# Patient Record
Sex: Male | Born: 1955 | Race: White | Hispanic: No | Marital: Married | Smoking: Never smoker
Health system: Southern US, Community
[De-identification: ages and names within clinical notes are randomized; demographics above are authoritative.]

## PROBLEM LIST (undated history)

## (undated) DIAGNOSIS — E669 Obesity, unspecified: Secondary | ICD-10-CM

## (undated) DIAGNOSIS — K219 Gastro-esophageal reflux disease without esophagitis: Secondary | ICD-10-CM

## (undated) DIAGNOSIS — G4730 Sleep apnea, unspecified: Secondary | ICD-10-CM

## (undated) DIAGNOSIS — I471 Supraventricular tachycardia, unspecified: Secondary | ICD-10-CM

## (undated) DIAGNOSIS — F32A Depression, unspecified: Secondary | ICD-10-CM

## (undated) DIAGNOSIS — F329 Major depressive disorder, single episode, unspecified: Secondary | ICD-10-CM

## (undated) DIAGNOSIS — F419 Anxiety disorder, unspecified: Secondary | ICD-10-CM

## (undated) DIAGNOSIS — Z9889 Other specified postprocedural states: Secondary | ICD-10-CM

## (undated) DIAGNOSIS — R002 Palpitations: Secondary | ICD-10-CM

## (undated) DIAGNOSIS — M1990 Unspecified osteoarthritis, unspecified site: Secondary | ICD-10-CM

## (undated) DIAGNOSIS — I10 Essential (primary) hypertension: Secondary | ICD-10-CM

## (undated) DIAGNOSIS — R748 Abnormal levels of other serum enzymes: Secondary | ICD-10-CM

## (undated) DIAGNOSIS — Z87442 Personal history of urinary calculi: Secondary | ICD-10-CM

## (undated) HISTORY — PX: OTHER SURGICAL HISTORY: SHX169

## (undated) HISTORY — DX: Anxiety disorder, unspecified: F41.9

## (undated) HISTORY — DX: Supraventricular tachycardia: I47.1

## (undated) HISTORY — DX: Obesity, unspecified: E66.9

## (undated) HISTORY — DX: Gastro-esophageal reflux disease without esophagitis: K21.9

## (undated) HISTORY — DX: Palpitations: R00.2

## (undated) HISTORY — DX: Essential (primary) hypertension: I10

## (undated) HISTORY — DX: Supraventricular tachycardia, unspecified: I47.10

## (undated) HISTORY — DX: Abnormal levels of other serum enzymes: R74.8

## (undated) HISTORY — DX: Sleep apnea, unspecified: G47.30

---

## 1995-01-23 HISTORY — PX: OTHER SURGICAL HISTORY: SHX169

## 2002-06-29 ENCOUNTER — Inpatient Hospital Stay (HOSPITAL_COMMUNITY): Admission: AD | Admit: 2002-06-29 | Discharge: 2002-07-01 | Payer: Self-pay | Admitting: Cardiology

## 2002-06-30 ENCOUNTER — Encounter: Payer: Self-pay | Admitting: Cardiology

## 2004-12-01 ENCOUNTER — Ambulatory Visit: Payer: Self-pay | Admitting: *Deleted

## 2004-12-11 ENCOUNTER — Ambulatory Visit: Payer: Self-pay | Admitting: Cardiology

## 2004-12-20 ENCOUNTER — Ambulatory Visit: Payer: Self-pay | Admitting: Cardiology

## 2010-06-27 DIAGNOSIS — R079 Chest pain, unspecified: Secondary | ICD-10-CM

## 2010-07-05 ENCOUNTER — Encounter: Payer: Self-pay | Admitting: *Deleted

## 2010-07-10 ENCOUNTER — Encounter: Payer: Self-pay | Admitting: Cardiology

## 2010-07-10 DIAGNOSIS — I10 Essential (primary) hypertension: Secondary | ICD-10-CM | POA: Insufficient documentation

## 2010-07-10 DIAGNOSIS — K219 Gastro-esophageal reflux disease without esophagitis: Secondary | ICD-10-CM | POA: Insufficient documentation

## 2010-07-10 DIAGNOSIS — R002 Palpitations: Secondary | ICD-10-CM | POA: Insufficient documentation

## 2010-07-10 DIAGNOSIS — G4730 Sleep apnea, unspecified: Secondary | ICD-10-CM | POA: Insufficient documentation

## 2010-07-10 DIAGNOSIS — R079 Chest pain, unspecified: Secondary | ICD-10-CM | POA: Insufficient documentation

## 2010-07-10 DIAGNOSIS — E669 Obesity, unspecified: Secondary | ICD-10-CM | POA: Insufficient documentation

## 2010-07-10 DIAGNOSIS — I471 Supraventricular tachycardia: Secondary | ICD-10-CM | POA: Insufficient documentation

## 2010-07-10 DIAGNOSIS — R748 Abnormal levels of other serum enzymes: Secondary | ICD-10-CM | POA: Insufficient documentation

## 2010-07-13 ENCOUNTER — Encounter: Payer: Self-pay | Admitting: Cardiology

## 2010-07-13 ENCOUNTER — Ambulatory Visit (INDEPENDENT_AMBULATORY_CARE_PROVIDER_SITE_OTHER): Payer: Federal, State, Local not specified - PPO | Admitting: Cardiology

## 2010-07-13 DIAGNOSIS — R748 Abnormal levels of other serum enzymes: Secondary | ICD-10-CM

## 2010-07-13 DIAGNOSIS — I10 Essential (primary) hypertension: Secondary | ICD-10-CM

## 2010-07-13 DIAGNOSIS — R079 Chest pain, unspecified: Secondary | ICD-10-CM

## 2010-07-13 DIAGNOSIS — R002 Palpitations: Secondary | ICD-10-CM

## 2010-07-13 NOTE — Assessment & Plan Note (Signed)
Blood pressure control today. No change in therapy. 

## 2010-07-13 NOTE — Patient Instructions (Signed)
Your physician wants you to follow-up in: 6 months. You will receive a reminder letter in the mail one-two months in advance. If you don't receive a letter, please call our office to schedule the follow-up appointment. Your physician recommends that you continue on your current medications as directed. Please refer to the Current Medication list given to you today. 

## 2010-07-13 NOTE — Assessment & Plan Note (Signed)
A careful discussion with him.  I suspect that he feels post PAC beats when he feels palpitations.  He is not having prolonged palpitations.  We discussed the possibility of an event recorder but he feel it is not needed at this time.

## 2010-07-13 NOTE — Progress Notes (Signed)
HPI The patient is seen for post hospital visit.  He is doing very well.  He was seen in consultation while he was at [facility] June 29, 2010.  He had a difficult they have worked, felt palpitations, and was admitted to the hospital.  There is a history of supraventricular tachycardia that was documented at 2004. I do not have all the specifics.  When he arrived at the hospital with the current admission there was no documented arrhythmia.  He stabilized.  There was no MI.  He was sent home.  It is of note that his troponins were normal.  However he seems to have mildly elevated CPKs chronically.As part of today's evaluation I reviewed the hospital H&P in all of the corresponding data  Patient has not had any recurrent marked symptoms.  He says that he rarely has palpitation  .No Known Allergies  Current Outpatient Prescriptions  Medication Sig Dispense Refill  . etodolac (LODINE) 400 MG tablet Take 400 mg by mouth 3 (three) times daily.       Marland Kitchen lisinopril (PRINIVIL,ZESTRIL) 10 MG tablet Take 10 mg by mouth daily.        Marland Kitchen loratadine (CLARITIN) 10 MG tablet Take 10 mg by mouth daily.        . metoprolol (TOPROL-XL) 50 MG 24 hr tablet Take 50 mg by mouth daily.        Marland Kitchen omeprazole (PRILOSEC) 20 MG capsule Take 20 mg by mouth daily.          History   Social History  . Marital Status: Married    Spouse Name: N/A    Number of Children: N/A  . Years of Education: N/A   Occupational History  .      works at the [employer]   Social History Main Topics  . Smoking status: Never Smoker   . Smokeless tobacco: Never Used  . Alcohol Use: No  . Drug Use: Not on file  . Sexually Active: Not on file   Other Topics Concern  . Not on file   Social History Narrative  . No narrative on file    Family History  Problem Relation Age of Onset  . Aneurysm Mother   . Heart disease Mother   . COPD Father   . Heart disease Other     family h/o  . Cancer Other     family h/o    Past  Medical History  Diagnosis Date  . Supraventricular tachycardia     successfully treated with adenosine,June 2004  . Chest pain     history of negative stress test,most recently November 2006,history of normal left ventricular function  . Hypertension   . GERD (gastroesophageal reflux disease)   . Obesity   . Elevated CPK     chronically elevated CPK'S  . Anxiety disorder   . Palpitations     with stress  . Sleep apnea     Past Surgical History  Procedure Date  . Left arm surgery     AS A CHILD  . Right shoulder surgery 1997    RECURRENT DISLOCATION AND REPAIR OF ROTATOR CUFF IN 12/2004 BY DR MERRITT    ROS  Patient denies fever, chills, headache, sweats, rash, change in vision, change in hearing, chest pain, cough, nausea vomiting, urinary symptoms.  All other systems are reviewed and are negative.  PHYSICAL EXAM Patient is stable today.  He is overweight.  Head is atraumatic.  Lungs are clear.  Respiratory  effort is not labored.  Cardiac exam reveals an S1-S2.  No clicks or significant murmurs.  There is no jugular venous distention.  The abdomen is soft.  There is no peripheral edema.  There are no musculoskeletal deformities.  No skin rashes. Filed Vitals:   07/13/10 1350  BP: 112/71  Pulse: 78  Height: 5\' 9"  (1.753 m)  Weight: 245 lb (111.131 kg)    EKG is not done today.  ASSESSMENT & PLAN

## 2010-07-13 NOTE — Assessment & Plan Note (Signed)
Etiology of the chronically elevated CPKs is not clear to me.

## 2010-07-13 NOTE — Assessment & Plan Note (Signed)
He did not have significant chest pain.  We are not convinced that he has ischemia.  He and I have carefully discussed the possibility of proceeding with a stress echo.  If we did this and would possibly need to be done with dobutamine as he had trouble walking on the treadmill in the past.  At this point we will not proceed

## 2012-08-03 DIAGNOSIS — R42 Dizziness and giddiness: Secondary | ICD-10-CM

## 2013-03-24 ENCOUNTER — Encounter: Payer: Self-pay | Admitting: Cardiology

## 2013-03-24 ENCOUNTER — Encounter (INDEPENDENT_AMBULATORY_CARE_PROVIDER_SITE_OTHER): Payer: Self-pay

## 2013-03-24 ENCOUNTER — Ambulatory Visit (INDEPENDENT_AMBULATORY_CARE_PROVIDER_SITE_OTHER): Payer: Federal, State, Local not specified - PPO | Admitting: Cardiology

## 2013-03-24 VITALS — BP 111/74 | HR 66 | Ht 69.0 in | Wt 261.8 lb

## 2013-03-24 DIAGNOSIS — R0989 Other specified symptoms and signs involving the circulatory and respiratory systems: Secondary | ICD-10-CM

## 2013-03-24 DIAGNOSIS — I498 Other specified cardiac arrhythmias: Secondary | ICD-10-CM

## 2013-03-24 DIAGNOSIS — R002 Palpitations: Secondary | ICD-10-CM

## 2013-03-24 DIAGNOSIS — R9430 Abnormal result of cardiovascular function study, unspecified: Secondary | ICD-10-CM

## 2013-03-24 DIAGNOSIS — I471 Supraventricular tachycardia: Secondary | ICD-10-CM

## 2013-03-24 DIAGNOSIS — I10 Essential (primary) hypertension: Secondary | ICD-10-CM

## 2013-03-24 DIAGNOSIS — R748 Abnormal levels of other serum enzymes: Secondary | ICD-10-CM

## 2013-03-24 DIAGNOSIS — Z01810 Encounter for preprocedural cardiovascular examination: Secondary | ICD-10-CM

## 2013-03-24 NOTE — Assessment & Plan Note (Signed)
He had an episode of SVT treated with adenosine in 2004. He has not had documented arrhythmias since then. No further workup is needed.

## 2013-03-24 NOTE — Assessment & Plan Note (Signed)
There is a history of mildly chronically elevated CPK.

## 2013-03-24 NOTE — Assessment & Plan Note (Signed)
The patient's overall cardiac status is stable. He is cleared to have knee surgery as indicated. There is no proven coronary disease. There is history of normal left ventricular function. His exercise level is good other than limitations by his knee. His EKG is normal. He has history of good left jugular function in the past. Overall he is stable from the cardiac viewpoint. He is cleared to have his knee surgery. He does not need any further testing.

## 2013-03-24 NOTE — Assessment & Plan Note (Signed)
In 2006 he had some chest discomfort. Nuclear study was normal. There's been no recurrence since then. No further workup is needed.

## 2013-03-24 NOTE — Patient Instructions (Signed)
Your physician recommends that you schedule a follow-up appointment as needed. Your physician recommends that you continue on your current medications as directed. Please refer to the Current Medication list given to you today. You are cleared for your orthopedic surgery and your surgeon will be contacted.

## 2013-03-24 NOTE — Assessment & Plan Note (Signed)
Blood pressures control. No change in therapy. 

## 2013-03-24 NOTE — Assessment & Plan Note (Signed)
Historically ejection fraction is normal. I've chosen not to repeat studies at this time.

## 2013-03-24 NOTE — Progress Notes (Signed)
HPI  The patient is seen today for preoperative cardiac clearance. He needs to have total knee repair. I have seen the patient in the past. In 2006 he had a stress nuclear scan. The ejection fraction was normal and there was no scar or ischemia. There is no documented coronary disease. He did have an episode of supraventricular tachycardia in the past. This was treated successfully with adenosine IV. He has not had any clinical recurrences. [facility]. At that time he had an extremely stressful situation at work. He felt poorly and was transported to the hospital. He stabilized and there was no definite cardiac abnormality. It is of note that there is a history of some mild chronic CPK elevation.  Recently he's been active other than difficulty with his knees. 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 This does not cause any significant symptoms. He is not having any chest pain or shortness of breath.  No Known Allergies  Current Outpatient Prescriptions  Medication Sig Dispense Refill  . diclofenac (VOLTAREN) 75 MG EC tablet Take 75 mg by mouth 2 (two) times daily.      Marland Kitchen. lisinopril-hydrochlorothiazide (PRINZIDE,ZESTORETIC) 20-12.5 MG per tablet Take 1 tablet by mouth every morning.      . loratadine (CLARITIN) 10 MG tablet Take 10 mg by mouth daily.        . metoprolol (TOPROL-XL) 50 MG 24 hr tablet Take 50 mg by mouth daily.        Marland Kitchen. omeprazole (PRILOSEC) 20 MG capsule Take 20 mg by mouth daily.         No current facility-administered medications for this visit.    History   Social History  . Marital Status: Married    Spouse Name: N/A    Number of Children: N/A  . Years of Education: N/A   Occupational History  .      works at the post office   Social History Main Topics  . Smoking status: Never Smoker   . Smokeless tobacco: Never Used  . Alcohol Use: No  . Drug Use: Not on file  . Sexual Activity:  Not on file   Other Topics Concern  . Not on file   Social History Narrative  . No narrative on file    Family History  Problem Relation Age of Onset  . Aneurysm Mother   . Heart disease Mother   . COPD Father   . Heart disease Other     family h/o  . Cancer Other     family h/o    Past Medical History  Diagnosis Date  . Supraventricular tachycardia     successfully treated with adenosine,June 2004  . Chest pain     history of negative stress test,most recently November 2006,history of normal left ventricular function  . Hypertension   . GERD (gastroesophageal reflux disease)   . Obesity   . Elevated CPK     chronically elevated CPK'S  . Anxiety disorder   . Palpitations     with stress  . Sleep apnea   . Ejection fraction     Past Surgical History  Procedure Laterality Date  . Left arm surgery      AS A CHILD  . Right shoulder surgery  1997    RECURRENT DISLOCATION AND REPAIR OF ROTATOR CUFF IN 12/2004 BY DR MERRITT    Patient Active Problem List   Diagnosis  Date Noted  . Ejection fraction   . Supraventricular tachycardia   . Chest pain   . Hypertension   . GERD (gastroesophageal reflux disease)   . Obesity   . Elevated CPK   . Palpitations   . Sleep apnea     ROS   Patient denies fever, chills, headache, sweats, rash, change in vision, change in hearing, chest pain, cough, nausea vomiting, urinary symptoms. All other systems are reviewed and are negative.  PHYSICAL EXAM  Patient is overweight. He is oriented to person time and place. Affect is normal. There is no jugulovenous distention. Lungs are clear. Respiratory effort is nonlabored. Cardiac exam reveals S1 and S2. There no clicks or significant murmurs. The abdomen is soft. There is no peripheral edema. There no musculoskeletal deformities. There are no skin rashes.  Filed Vitals:   03/24/13 1438  BP: 111/74  Pulse: 66  Height: 5\' 9"  (1.753 m)  Weight: 261 lb 12.8 oz (118.752 kg)  SpO2:  98%   EKG is done today and reviewed by me. There is normal sinus rhythm. This is a normal EKG. I compared this EKG to a tracing dated August 03, 2012. There is no change.  ASSESSMENT & PLAN

## 2013-04-03 ENCOUNTER — Telehealth: Payer: Self-pay | Admitting: Cardiology

## 2013-04-03 NOTE — Telephone Encounter (Signed)
New message  Tresa EndoKelly with [facility] called to follow up on the clearance for total knee replacement that was faxed 03/16/2013. She states she will fax another

## 2013-04-03 NOTE — Telephone Encounter (Signed)
Clearance was given on last ov/ I will take that to MR to be faxed.

## 2013-04-13 ENCOUNTER — Encounter (HOSPITAL_COMMUNITY): Payer: Self-pay | Admitting: Pharmacy Technician

## 2013-04-16 ENCOUNTER — Other Ambulatory Visit: Payer: Self-pay | Admitting: Physician Assistant

## 2013-04-16 NOTE — Pre-Procedure Instructions (Addendum)
Keefe Zawistowski  04/16/2013   Your procedure is scheduled on:  Fri, April 3   Report to [facility] Entrance A at 7:30 AM.  Call this number if you have problems the morning of surgery: [phone]   Remember:   Do not eat food or drink liquids after midnight.   Take these medicines the morning of surgery with A SIP OF WATER: Claritin(Loratadine-if needed),Omeprazole(Prilosec),Metoprolol(Toprol),and Pain Pill(if needed)              Stop taking your  and Diclfenac. Stop using your Diclofenac gel. No Goody's,BC's,Aleve,Ibuprofen,Fish Oil,or any Herbal Medications.   Do not wear jewelry  Do not wear lotions, powders, or perfumes. You may wear deodorant.  Do not shave 48 hours prior to surgery. Men may shave face and neck.  Do not bring valuables to the hospital.  [facility] is not responsible                  for any belongings or valuables.               Contacts, dentures or bridgework may not be worn into surgery.  Leave suitcase in the car. After surgery it may be brought to your room.  For patients admitted to the hospital, discharge time is determined by your                treatment team.                 Special Instructions:  [place] - Preparing for Surgery  Before surgery, you can play an important role.  Because skin is not sterile, your skin needs to be as free of germs as possible.  You can reduce the number of germs on you skin by washing with CHG (chlorahexidine gluconate) soap before surgery.  CHG is an antiseptic cleaner which kills germs and bonds with the skin to continue killing germs even after washing.  Please DO NOT use if you have an allergy to CHG or antibacterial soaps.  If your skin becomes reddened/irritated stop using the CHG and inform your nurse when you arrive at Short Stay.  Do not shave (including legs and underarms) for at least 48 hours prior to the first CHG shower.  You may shave your face.  Please follow these instructions carefully:   1.   Shower with CHG Soap the night before surgery and the                                morning of Surgery.  2.  If you choose to wash your hair, wash your hair first as usual with your       normal shampoo.  3.  After you shampoo, rinse your hair and body thoroughly to remove the                      Shampoo.  4.  Use CHG as you would any other liquid soap.  You can apply chg directly       to the skin and wash gently with scrungie or a clean washcloth.  5.  Apply the CHG Soap to your body ONLY FROM THE NECK DOWN.        Do not use on open wounds or open sores.  Avoid contact with your eyes,       ears, mouth and genitals (private parts).  Wash genitals (private  parts)       with your normal soap.  6.  Wash thoroughly, paying special attention to the area where your surgery        will be performed.  7.  Thoroughly rinse your body with warm water from the neck down.  8.  DO NOT shower/wash with your normal soap after using and rinsing off       the CHG Soap.  9.  Pat yourself dry with a clean towel.            10.  Wear clean pajamas.            11.  Place clean sheets on your bed the night of your first shower and do not        sleep with pets.  Day of Surgery  Do not apply any lotions/deoderants the morning of surgery.  Please wear clean clothes to the hospital/surgery center.     Please read over the following fact sheets that you were given: Pain Booklet, Coughing and Deep Breathing, Blood Transfusion Information, MRSA Information and Surgical Site Infection Prevention

## 2013-04-16 NOTE — Progress Notes (Signed)
Anesthesia Chart Review:  Patient is a 58 year old male scheduled for right TKA on 04/24/13 by Dr. Madelon Lipsaffrey.  History includes non-smoker, SVT (treated with adenosine '04) on b-blocker therapy, chest pain in 2006 with negative stress test, mild chronic CPK elevation, HTN, GERD, anxiety disorder, OSA, obesity. PCP is Dr. Donzetta Sprungerry Daniel.  He referred patient to cardiologist Dr. Myrtis SerKatz for a preoperative evaluation who cleared him without need for additional pre-operative cardiac testing.    EKG on 03/24/13 showed NSR.  Additional diagnostic testing pending his PAT evaluation.   Velna Ochsllison [place] Sarinana, PA-C [facility]/Anesthesiology Phone [phone] 04/16/2013 4:33 PM

## 2013-04-16 NOTE — H&P (Signed)
TOTAL KNEE ADMISSION H&P  Patient is being admitted for right total knee arthroplasty.  Subjective:  Chief Complaint:right knee pain.  HPI: Alan Swanson, 58 y.o. male, has a history of pain and functional disability in the right knee due to arthritis and has failed non-surgical conservative treatments for greater than 12 weeks to includeNSAID's and/or analgesics, corticosteriod injections, viscosupplementation injections, flexibility and strengthening excercises, supervised PT with diminished ADL's post treatment, use of assistive devices and activity modification.  Onset of symptoms was gradual, starting 1 years ago with gradually worsening course since that time. The patient noted prior procedures on the knee to include  arthroscopy, menisectomy and microfracture on the right knee(s).  Patient currently rates pain in the right knee(s) at 9 out of 10 with activity. Patient has night pain, worsening of pain with activity and weight bearing, pain that interferes with activities of daily living, pain with passive range of motion, crepitus and joint swelling.  Patient has evidence of periarticular osteophytes and joint space narrowing by imaging studies.. There is no active infection.  Patient Active Problem List   Diagnosis Date Noted  . Preop cardiovascular exam 03/24/2013  . Ejection fraction   . Supraventricular tachycardia   . Chest pain   . Hypertension   . GERD (gastroesophageal reflux disease)   . Obesity   . Elevated CPK   . Palpitations   . Sleep apnea    Past Medical History  Diagnosis Date  . Supraventricular tachycardia     successfully treated with adenosine,June 2004  . Chest pain     history of negative stress test,most recently November 2006,history of normal left ventricular function  . Hypertension   . GERD (gastroesophageal reflux disease)   . Obesity   . Elevated CPK     chronically elevated CPK'S  . Anxiety disorder   . Palpitations     with stress  . Sleep  apnea   . Ejection fraction     Past Surgical History  Procedure Laterality Date  . Left arm surgery      AS A CHILD  . Right shoulder surgery  1997    RECURRENT DISLOCATION AND REPAIR OF ROTATOR CUFF IN 12/2004 BY DR MERRITT     (Not in a hospital admission) No Known Allergies  History  Substance Use Topics  . Smoking status: Never Smoker   . Smokeless tobacco: Never Used  . Alcohol Use: No    Family History  Problem Relation Age of Onset  . Aneurysm Mother   . Heart disease Mother   . COPD Father   . Heart disease Other     family h/o  . Cancer Other     family h/o     Review of Systems  Constitutional: Negative.   HENT: Positive for hearing loss and tinnitus. Negative for congestion, ear discharge, ear pain, nosebleeds and sore throat.   Eyes: Negative.   Respiratory: Positive for shortness of breath. Negative for cough, hemoptysis, sputum production, wheezing and stridor.   Cardiovascular: Positive for palpitations. Negative for chest pain, orthopnea, claudication, leg swelling and PND.  Gastrointestinal: Negative.   Genitourinary: Negative.   Musculoskeletal: Positive for joint pain. Negative for falls.  Skin: Negative.   Neurological: Negative.  Negative for headaches.  Endo/Heme/Allergies: Negative.   Psychiatric/Behavioral: Negative.     Objective:  Physical Exam  Constitutional: He is oriented to person, place, and time. He appears well-developed and well-nourished. No distress.  HENT:  Head: Normocephalic and atraumatic.  Nose:  Nose normal.  Eyes: Conjunctivae and EOM are normal. Pupils are equal, round, and reactive to light.  Neck: Normal range of motion. Neck supple.  Cardiovascular: Normal rate, regular rhythm, normal heart sounds and intact distal pulses.   Respiratory: Effort normal and breath sounds normal. No respiratory distress. He has no wheezes. He has no rales. He exhibits no tenderness.  GI: Soft. Bowel sounds are normal. He exhibits no  distension. There is no tenderness.  Musculoskeletal:       Right knee: He exhibits bony tenderness. He exhibits no laceration, no erythema, no LCL laxity and no MCL laxity. Tenderness found. Medial joint line and lateral joint line tenderness noted.  Lymphadenopathy:    He has no cervical adenopathy.  Neurological: He is alert and oriented to person, place, and time. No cranial nerve deficit.  Skin: Skin is warm and dry. No rash noted. No erythema.  Psychiatric: He has a normal mood and affect. His behavior is normal.    Vital signs in last 24 hours: @VSRANGES @  Labs:   Estimated body mass index is 38.64 kg/(m^2) as calculated from the following:   Height as of 03/24/13: 5\' 9"  (1.753 m).   Weight as of 03/24/13: 118.752 kg (261 lb 12.8 oz).   Imaging Review Plain radiographs demonstrate severe degenerative joint disease of the right knee(s). The overall alignment ismild varus. The bone quality appears to be good for age and reported activity level.  Assessment/Plan:  End stage arthritis, right knee   The patient history, physical examination, clinical judgment of the provider and imaging studies are consistent with end stage degenerative joint disease of the right knee(s) and total knee arthroplasty is deemed medically necessary. The treatment options including medical management, injection therapy arthroscopy and arthroplasty were discussed at length. The risks and benefits of total knee arthroplasty were presented and reviewed. The risks due to aseptic loosening, infection, stiffness, patella tracking problems, thromboembolic complications and other imponderables were discussed. The patient acknowledged the explanation, agreed to proceed with the plan and consent was signed. Patient is being admitted for inpatient treatment for surgery, pain control, PT, OT, prophylactic antibiotics, VTE prophylaxis, progressive ambulation and ADL's and discharge planning. The patient is planning to be  discharged home with home health services

## 2013-04-17 ENCOUNTER — Encounter (HOSPITAL_COMMUNITY)
Admission: RE | Admit: 2013-04-17 | Discharge: 2013-04-17 | Disposition: A | Discharge status: Home / Self Care | Source: Ambulatory Visit | Attending: Orthopedic Surgery | Admitting: Orthopedic Surgery

## 2013-04-17 ENCOUNTER — Encounter (HOSPITAL_COMMUNITY): Payer: Self-pay

## 2013-04-17 ENCOUNTER — Encounter (HOSPITAL_COMMUNITY)
Admission: RE | Admit: 2013-04-17 | Discharge: 2013-04-17 | Disposition: A | Discharge status: Home / Self Care | Source: Ambulatory Visit | Attending: Anesthesiology | Admitting: Anesthesiology

## 2013-04-17 DIAGNOSIS — Z01812 Encounter for preprocedural laboratory examination: Secondary | ICD-10-CM | POA: Diagnosis not present

## 2013-04-17 DIAGNOSIS — Z01818 Encounter for other preprocedural examination: Secondary | ICD-10-CM | POA: Insufficient documentation

## 2013-04-17 HISTORY — DX: Other specified postprocedural states: Z98.890

## 2013-04-17 HISTORY — DX: Depression, unspecified: F32.A

## 2013-04-17 HISTORY — DX: Unspecified osteoarthritis, unspecified site: M19.90

## 2013-04-17 HISTORY — DX: Major depressive disorder, single episode, unspecified: F32.9

## 2013-04-17 HISTORY — DX: Personal history of urinary calculi: Z87.442

## 2013-04-17 LAB — CBC
HEMATOCRIT: 44.1 % (ref 39.0–52.0)
Hemoglobin: 14.9 g/dL (ref 13.0–17.0)
MCH: 29.7 pg (ref 26.0–34.0)
MCHC: 33.8 g/dL (ref 30.0–36.0)
MCV: 87.8 fL (ref 78.0–100.0)
Platelets: 169 10*3/uL (ref 150–400)
RBC: 5.02 MIL/uL (ref 4.22–5.81)
RDW: 13.3 % (ref 11.5–15.5)
WBC: 8.2 10*3/uL (ref 4.0–10.5)

## 2013-04-17 LAB — BASIC METABOLIC PANEL
BUN: 29 mg/dL — AB (ref 6–23)
CO2: 26 mEq/L (ref 19–32)
CREATININE: 1.6 mg/dL — AB (ref 0.50–1.35)
Calcium: 9.8 mg/dL (ref 8.4–10.5)
Chloride: 100 mEq/L (ref 96–112)
GFR calc Af Amer: 54 mL/min — ABNORMAL LOW (ref >90)
GFR, EST NON AFRICAN AMERICAN: 47 mL/min — AB (ref >90)
Glucose, Bld: 106 mg/dL — ABNORMAL HIGH (ref 70–99)
Potassium: 4.4 mEq/L (ref 3.7–5.3)
Sodium: 140 mEq/L (ref 137–147)

## 2013-04-17 LAB — ABO/RH: ABO/RH(D): O POS

## 2013-04-17 LAB — PROTIME-INR
INR: 0.95 (ref 0.00–1.49)
Prothrombin Time: 12.5 seconds (ref 11.6–15.2)

## 2013-04-17 LAB — APTT: aPTT: 30 seconds (ref 24–37)

## 2013-04-17 LAB — TYPE AND SCREEN
ABO/RH(D): O POS
ANTIBODY SCREEN: NEGATIVE

## 2013-04-17 LAB — SURGICAL PCR SCREEN
MRSA, PCR: NEGATIVE
Staphylococcus aureus: NEGATIVE

## 2013-04-30 MED ORDER — TRANEXAMIC ACID 100 MG/ML IV SOLN
1000.0000 mg | INTRAVENOUS | Status: AC
  Administered 2013-05-01: 1000 mg via INTRAVENOUS
  Filled 2013-04-30 (×2): qty 10

## 2013-04-30 MED ORDER — CEFAZOLIN SODIUM-DEXTROSE 2-3 GM-% IV SOLR
2.0000 g | INTRAVENOUS | Status: AC
Start: 2013-05-01 — End: 2013-05-01
  Administered 2013-05-01: 2 g via INTRAVENOUS
  Filled 2013-04-30: qty 50

## 2013-05-01 ENCOUNTER — Encounter (HOSPITAL_COMMUNITY): Payer: Self-pay | Admitting: *Deleted

## 2013-05-01 ENCOUNTER — Encounter (HOSPITAL_COMMUNITY): Admitting: Vascular Surgery

## 2013-05-01 ENCOUNTER — Encounter (HOSPITAL_COMMUNITY)
Admission: RE | Disposition: A | Discharge status: Home Health | Payer: Self-pay | Source: Ambulatory Visit | Attending: Orthopedic Surgery

## 2013-05-01 ENCOUNTER — Inpatient Hospital Stay (HOSPITAL_COMMUNITY): Admitting: Anesthesiology

## 2013-05-01 ENCOUNTER — Inpatient Hospital Stay (HOSPITAL_COMMUNITY)
Admission: RE | Admit: 2013-05-01 | Discharge: 2013-05-04 | DRG: 470 | Disposition: A | Discharge status: Home Health | Source: Ambulatory Visit | Attending: Orthopedic Surgery | Admitting: Orthopedic Surgery

## 2013-05-01 DIAGNOSIS — I10 Essential (primary) hypertension: Secondary | ICD-10-CM | POA: Diagnosis present

## 2013-05-01 DIAGNOSIS — E669 Obesity, unspecified: Secondary | ICD-10-CM | POA: Diagnosis present

## 2013-05-01 DIAGNOSIS — M1710 Unilateral primary osteoarthritis, unspecified knee: Principal | ICD-10-CM | POA: Diagnosis present

## 2013-05-01 DIAGNOSIS — K219 Gastro-esophageal reflux disease without esophagitis: Secondary | ICD-10-CM | POA: Diagnosis present

## 2013-05-01 DIAGNOSIS — I498 Other specified cardiac arrhythmias: Secondary | ICD-10-CM | POA: Diagnosis present

## 2013-05-01 DIAGNOSIS — N289 Disorder of kidney and ureter, unspecified: Secondary | ICD-10-CM | POA: Diagnosis present

## 2013-05-01 DIAGNOSIS — F3289 Other specified depressive episodes: Secondary | ICD-10-CM | POA: Diagnosis present

## 2013-05-01 DIAGNOSIS — G4733 Obstructive sleep apnea (adult) (pediatric): Secondary | ICD-10-CM | POA: Diagnosis present

## 2013-05-01 DIAGNOSIS — F329 Major depressive disorder, single episode, unspecified: Secondary | ICD-10-CM | POA: Diagnosis present

## 2013-05-01 DIAGNOSIS — Z6836 Body mass index (BMI) 36.0-36.9, adult: Secondary | ICD-10-CM

## 2013-05-01 DIAGNOSIS — F411 Generalized anxiety disorder: Secondary | ICD-10-CM | POA: Diagnosis present

## 2013-05-01 DIAGNOSIS — M25569 Pain in unspecified knee: Secondary | ICD-10-CM | POA: Diagnosis present

## 2013-05-01 DIAGNOSIS — M1711 Unilateral primary osteoarthritis, right knee: Secondary | ICD-10-CM | POA: Diagnosis present

## 2013-05-01 HISTORY — PX: TOTAL KNEE ARTHROPLASTY: SHX125

## 2013-05-01 LAB — CBC
HCT: 42.7 % (ref 39.0–52.0)
Hemoglobin: 14.4 g/dL (ref 13.0–17.0)
MCH: 29.6 pg (ref 26.0–34.0)
MCHC: 33.7 g/dL (ref 30.0–36.0)
MCV: 87.7 fL (ref 78.0–100.0)
Platelets: 188 10*3/uL (ref 150–400)
RBC: 4.87 MIL/uL (ref 4.22–5.81)
RDW: 13.7 % (ref 11.5–15.5)
WBC: 19.3 10*3/uL — AB (ref 4.0–10.5)

## 2013-05-01 LAB — CREATININE, SERUM
CREATININE: 1.16 mg/dL (ref 0.50–1.35)
GFR calc non Af Amer: 69 mL/min — ABNORMAL LOW (ref >90)
GFR, EST AFRICAN AMERICAN: 80 mL/min — AB (ref >90)

## 2013-05-01 SURGERY — ARTHROPLASTY, KNEE, TOTAL
Anesthesia: General | Site: Knee | Laterality: Right

## 2013-05-01 MED ORDER — MIDAZOLAM HCL 2 MG/2ML IJ SOLN
INTRAMUSCULAR | Status: AC
  Administered 2013-05-01: 2 mg
  Filled 2013-05-01: qty 2

## 2013-05-01 MED ORDER — CHLORHEXIDINE GLUCONATE 4 % EX LIQD: 60.0000 mL | Freq: Once | CUTANEOUS | Status: DC

## 2013-05-01 MED ORDER — METHOCARBAMOL 500 MG PO TABS
500.0000 mg | ORAL_TABLET | Freq: Four times a day (QID) | ORAL | Status: DC | PRN
  Administered 2013-05-02 – 2013-05-04 (×7): 500 mg via ORAL
  Filled 2013-05-01 (×8): qty 1

## 2013-05-01 MED ORDER — SUCCINYLCHOLINE CHLORIDE 20 MG/ML IJ SOLN
INTRAMUSCULAR | Status: DC | PRN
  Administered 2013-05-01: 120 mg via INTRAVENOUS

## 2013-05-01 MED ORDER — DEXAMETHASONE SODIUM PHOSPHATE 10 MG/ML IJ SOLN
10.0000 mg | Freq: Three times a day (TID) | INTRAMUSCULAR | Status: AC
  Administered 2013-05-01 (×2): 10 mg via INTRAVENOUS
  Filled 2013-05-01 (×3): qty 1

## 2013-05-01 MED ORDER — OXYCODONE HCL 5 MG PO TABS: 5.0000 mg | ORAL_TABLET | Freq: Once | ORAL | Status: DC | PRN

## 2013-05-01 MED ORDER — MIDAZOLAM HCL 2 MG/2ML IJ SOLN
INTRAMUSCULAR | Status: AC
  Filled 2013-05-01: qty 2

## 2013-05-01 MED ORDER — SUCCINYLCHOLINE CHLORIDE 20 MG/ML IJ SOLN
INTRAMUSCULAR | Status: AC
  Filled 2013-05-01: qty 1

## 2013-05-01 MED ORDER — DEXTROSE 5 % IV SOLN
500.0000 mg | Freq: Four times a day (QID) | INTRAVENOUS | Status: DC | PRN
  Filled 2013-05-01: qty 5

## 2013-05-01 MED ORDER — PHENOL 1.4 % MT LIQD: 1.0000 | OROMUCOSAL | Status: DC | PRN

## 2013-05-01 MED ORDER — HYDROCHLOROTHIAZIDE 12.5 MG PO CAPS
12.5000 mg | ORAL_CAPSULE | Freq: Every day | ORAL | Status: DC
  Administered 2013-05-02 – 2013-05-04 (×3): 12.5 mg via ORAL
  Filled 2013-05-01 (×3): qty 1

## 2013-05-01 MED ORDER — BISACODYL 5 MG PO TBEC
5.0000 mg | DELAYED_RELEASE_TABLET | Freq: Every day | ORAL | Status: DC | PRN
Start: 2013-05-01 — End: 2013-05-04
  Administered 2013-05-03: 5 mg via ORAL
  Filled 2013-05-01: qty 1

## 2013-05-01 MED ORDER — PANTOPRAZOLE SODIUM 40 MG PO TBEC
40.0000 mg | DELAYED_RELEASE_TABLET | Freq: Every day | ORAL | Status: DC
Start: 2013-05-02 — End: 2013-05-04
  Administered 2013-05-02 – 2013-05-04 (×3): 40 mg via ORAL
  Filled 2013-05-01 (×3): qty 1

## 2013-05-01 MED ORDER — DEXAMETHASONE SODIUM PHOSPHATE 10 MG/ML IJ SOLN
INTRAMUSCULAR | Status: DC | PRN
  Administered 2013-05-01: 4 mg

## 2013-05-01 MED ORDER — CEFAZOLIN SODIUM-DEXTROSE 2-3 GM-% IV SOLR
2.0000 g | Freq: Four times a day (QID) | INTRAVENOUS | Status: AC
  Administered 2013-05-01 (×2): 2 g via INTRAVENOUS
  Filled 2013-05-01 (×3): qty 50

## 2013-05-01 MED ORDER — FENTANYL CITRATE 0.05 MG/ML IJ SOLN
100.0000 ug | Freq: Once | INTRAMUSCULAR | Status: AC
  Administered 2013-05-01: 100 ug via INTRAVENOUS

## 2013-05-01 MED ORDER — LIDOCAINE HCL (CARDIAC) 20 MG/ML IV SOLN
INTRAVENOUS | Status: DC | PRN
  Administered 2013-05-01: 100 mg via INTRAVENOUS

## 2013-05-01 MED ORDER — MENTHOL 3 MG MT LOZG: 1.0000 | LOZENGE | OROMUCOSAL | Status: DC | PRN

## 2013-05-01 MED ORDER — PROMETHAZINE HCL 25 MG/ML IJ SOLN
INTRAMUSCULAR | Status: AC
Start: 2013-05-01 — End: 2013-05-02
  Filled 2013-05-01: qty 1

## 2013-05-01 MED ORDER — OXYCODONE HCL 5 MG PO TABS
5.0000 mg | ORAL_TABLET | ORAL | Status: DC | PRN
  Administered 2013-05-01 – 2013-05-02 (×4): 10 mg via ORAL
  Administered 2013-05-02: 5 mg via ORAL
  Administered 2013-05-02 – 2013-05-04 (×12): 10 mg via ORAL
  Filled 2013-05-01 (×18): qty 2

## 2013-05-01 MED ORDER — SENNOSIDES-DOCUSATE SODIUM 8.6-50 MG PO TABS: 1.0000 | ORAL_TABLET | Freq: Every evening | ORAL | Status: DC | PRN

## 2013-05-01 MED ORDER — FENTANYL CITRATE 0.05 MG/ML IJ SOLN
INTRAMUSCULAR | Status: AC
  Filled 2013-05-01: qty 5

## 2013-05-01 MED ORDER — LISINOPRIL-HYDROCHLOROTHIAZIDE 20-12.5 MG PO TABS: 1.0000 | ORAL_TABLET | Freq: Every morning | ORAL | Status: DC

## 2013-05-01 MED ORDER — LABETALOL HCL 5 MG/ML IV SOLN
INTRAVENOUS | Status: AC
  Filled 2013-05-01: qty 4

## 2013-05-01 MED ORDER — LIDOCAINE HCL (CARDIAC) 20 MG/ML IV SOLN
INTRAVENOUS | Status: AC
  Filled 2013-05-01: qty 5

## 2013-05-01 MED ORDER — HYDROMORPHONE HCL PF 1 MG/ML IJ SOLN
0.2500 mg | INTRAMUSCULAR | Status: DC | PRN
  Administered 2013-05-01 (×2): 0.5 mg via INTRAVENOUS

## 2013-05-01 MED ORDER — BUPIVACAINE-EPINEPHRINE PF 0.5-1:200000 % IJ SOLN
INTRAMUSCULAR | Status: DC | PRN
  Administered 2013-05-01: 25 mL

## 2013-05-01 MED ORDER — LORATADINE 10 MG PO TABS
10.0000 mg | ORAL_TABLET | Freq: Every day | ORAL | Status: DC | PRN
Start: 2013-05-01 — End: 2013-05-04
  Filled 2013-05-01: qty 1

## 2013-05-01 MED ORDER — METOCLOPRAMIDE HCL 5 MG/ML IJ SOLN: 5.0000 mg | Freq: Three times a day (TID) | INTRAMUSCULAR | Status: DC | PRN

## 2013-05-01 MED ORDER — ACETAMINOPHEN 650 MG RE SUPP: 650.0000 mg | Freq: Four times a day (QID) | RECTAL | Status: DC | PRN

## 2013-05-01 MED ORDER — FENTANYL CITRATE 0.05 MG/ML IJ SOLN: 50.0000 ug | Freq: Once | INTRAMUSCULAR | Status: DC

## 2013-05-01 MED ORDER — ONDANSETRON HCL 4 MG/2ML IJ SOLN
4.0000 mg | Freq: Four times a day (QID) | INTRAMUSCULAR | Status: DC | PRN
  Administered 2013-05-01: 4 mg via INTRAVENOUS
  Filled 2013-05-01: qty 2

## 2013-05-01 MED ORDER — PROPOFOL 10 MG/ML IV BOLUS
INTRAVENOUS | Status: AC
  Filled 2013-05-01: qty 20

## 2013-05-01 MED ORDER — ONDANSETRON HCL 4 MG PO TABS
4.0000 mg | ORAL_TABLET | Freq: Four times a day (QID) | ORAL | Status: DC | PRN
  Administered 2013-05-02: 4 mg via ORAL
  Filled 2013-05-01: qty 1

## 2013-05-01 MED ORDER — OXYCODONE HCL 5 MG PO TABS: ORAL_TABLET | ORAL | Status: DC

## 2013-05-01 MED ORDER — ENOXAPARIN SODIUM 30 MG/0.3ML ~~LOC~~ SOLN
30.0000 mg | Freq: Two times a day (BID) | SUBCUTANEOUS | Status: DC
  Administered 2013-05-02 – 2013-05-04 (×5): 30 mg via SUBCUTANEOUS
  Filled 2013-05-01 (×7): qty 0.3

## 2013-05-01 MED ORDER — SODIUM CHLORIDE 0.9 % IR SOLN
Status: DC | PRN
  Administered 2013-05-01: 1000 mL

## 2013-05-01 MED ORDER — FENTANYL CITRATE 0.05 MG/ML IJ SOLN
INTRAMUSCULAR | Status: AC
  Filled 2013-05-01: qty 2

## 2013-05-01 MED ORDER — 0.9 % SODIUM CHLORIDE (POUR BTL) OPTIME
TOPICAL | Status: DC | PRN
  Administered 2013-05-01: 1000 mL

## 2013-05-01 MED ORDER — LACTATED RINGERS IV SOLN
INTRAVENOUS | Status: DC
  Administered 2013-05-01: 08:00:00 via INTRAVENOUS

## 2013-05-01 MED ORDER — METOCLOPRAMIDE HCL 10 MG PO TABS: 5.0000 mg | ORAL_TABLET | Freq: Three times a day (TID) | ORAL | Status: DC | PRN

## 2013-05-01 MED ORDER — LISINOPRIL 20 MG PO TABS
20.0000 mg | ORAL_TABLET | Freq: Every day | ORAL | Status: DC
  Administered 2013-05-02 – 2013-05-03 (×2): 20 mg via ORAL
  Filled 2013-05-01 (×3): qty 1

## 2013-05-01 MED ORDER — DOCUSATE SODIUM 100 MG PO CAPS
100.0000 mg | ORAL_CAPSULE | Freq: Two times a day (BID) | ORAL | Status: DC
  Administered 2013-05-01 – 2013-05-04 (×6): 100 mg via ORAL
  Filled 2013-05-01 (×8): qty 1

## 2013-05-01 MED ORDER — MIDAZOLAM HCL 2 MG/2ML IJ SOLN: 1.0000 mg | INTRAMUSCULAR | Status: DC | PRN

## 2013-05-01 MED ORDER — DEXAMETHASONE SODIUM PHOSPHATE 10 MG/ML IJ SOLN
INTRAMUSCULAR | Status: DC | PRN
  Administered 2013-05-01: 10 mg via INTRAVENOUS

## 2013-05-01 MED ORDER — SODIUM CHLORIDE 0.9 % IV SOLN: INTRAVENOUS | Status: DC

## 2013-05-01 MED ORDER — ONDANSETRON HCL 4 MG/2ML IJ SOLN
INTRAMUSCULAR | Status: AC
  Filled 2013-05-01: qty 2

## 2013-05-01 MED ORDER — SODIUM CHLORIDE 0.9 % IV SOLN
INTRAVENOUS | Status: DC
  Administered 2013-05-01: 75 mL/h via INTRAVENOUS

## 2013-05-01 MED ORDER — ENOXAPARIN SODIUM 30 MG/0.3ML ~~LOC~~ SOLN: 30.0000 mg | Freq: Two times a day (BID) | SUBCUTANEOUS | Status: DC

## 2013-05-01 MED ORDER — PROMETHAZINE HCL 25 MG/ML IJ SOLN
6.2500 mg | INTRAMUSCULAR | Status: AC | PRN
  Administered 2013-05-01 (×2): 6.25 mg via INTRAVENOUS

## 2013-05-01 MED ORDER — DEXAMETHASONE 6 MG PO TABS
10.0000 mg | ORAL_TABLET | Freq: Three times a day (TID) | ORAL | Status: AC
  Administered 2013-05-02: 10 mg via ORAL
  Filled 2013-05-01 (×3): qty 1

## 2013-05-01 MED ORDER — HYDROMORPHONE HCL PF 1 MG/ML IJ SOLN
1.0000 mg | INTRAMUSCULAR | Status: DC | PRN
  Administered 2013-05-01 – 2013-05-02 (×4): 1 mg via INTRAVENOUS
  Filled 2013-05-01 (×4): qty 1

## 2013-05-01 MED ORDER — METOPROLOL SUCCINATE ER 50 MG PO TB24
50.0000 mg | ORAL_TABLET | Freq: Every day | ORAL | Status: DC
  Administered 2013-05-02 – 2013-05-03 (×2): 50 mg via ORAL
  Filled 2013-05-01 (×3): qty 1

## 2013-05-01 MED ORDER — OXYCODONE HCL 5 MG/5ML PO SOLN: 5.0000 mg | Freq: Once | ORAL | Status: DC | PRN

## 2013-05-01 MED ORDER — MIDAZOLAM HCL 2 MG/2ML IJ SOLN
2.0000 mg | Freq: Once | INTRAMUSCULAR | Status: DC
Start: 2013-05-01 — End: 2013-05-01

## 2013-05-01 MED ORDER — ONDANSETRON HCL 4 MG/2ML IJ SOLN
INTRAMUSCULAR | Status: DC | PRN
  Administered 2013-05-01: 4 mg via INTRAVENOUS

## 2013-05-01 MED ORDER — FLEET ENEMA 7-19 GM/118ML RE ENEM: 1.0000 | ENEMA | Freq: Once | RECTAL | Status: AC | PRN

## 2013-05-01 MED ORDER — PROPOFOL 10 MG/ML IV BOLUS
INTRAVENOUS | Status: DC | PRN
  Administered 2013-05-01: 200 mg via INTRAVENOUS

## 2013-05-01 MED ORDER — HYDROMORPHONE HCL PF 1 MG/ML IJ SOLN
INTRAMUSCULAR | Status: AC
  Administered 2013-05-01: 1 mg via INTRAVENOUS
  Filled 2013-05-01: qty 1

## 2013-05-01 MED ORDER — LACTATED RINGERS IV SOLN
INTRAVENOUS | Status: DC | PRN
  Administered 2013-05-01 (×2): via INTRAVENOUS

## 2013-05-01 MED ORDER — FENTANYL CITRATE 0.05 MG/ML IJ SOLN
INTRAMUSCULAR | Status: DC | PRN
  Administered 2013-05-01: 100 ug via INTRAVENOUS
  Administered 2013-05-01 (×3): 50 ug via INTRAVENOUS
  Administered 2013-05-01: 100 ug via INTRAVENOUS
  Administered 2013-05-01: 50 ug via INTRAVENOUS
  Administered 2013-05-01: 100 ug via INTRAVENOUS

## 2013-05-01 MED ORDER — ACETAMINOPHEN 325 MG PO TABS: 650.0000 mg | ORAL_TABLET | Freq: Four times a day (QID) | ORAL | Status: DC | PRN

## 2013-05-01 SURGICAL SUPPLY — 65 items
BANDAGE ELASTIC 4 VELCRO ST LF (GAUZE/BANDAGES/DRESSINGS) ×2 IMPLANT
BANDAGE ELASTIC 6 VELCRO ST LF (GAUZE/BANDAGES/DRESSINGS) ×2 IMPLANT
BANDAGE ESMARK 6X9 LF (GAUZE/BANDAGES/DRESSINGS) ×1 IMPLANT
BLADE SAGITTAL 25.0X1.19X90 (BLADE) ×2 IMPLANT
BLADE SAW SAG 90X13X1.27 (BLADE) ×2 IMPLANT
BNDG ESMARK 6X9 LF (GAUZE/BANDAGES/DRESSINGS) ×2
BOWL SMART MIX CTS (DISPOSABLE) ×2 IMPLANT
CAPT RP KNEE ×2 IMPLANT
CEMENT HV SMART SET (Cement) ×4 IMPLANT
COVER SURGICAL LIGHT HANDLE (MISCELLANEOUS) ×2 IMPLANT
CUFF TOURNIQUET SINGLE 34IN LL (TOURNIQUET CUFF) ×2 IMPLANT
CUFF TOURNIQUET SINGLE 44IN (TOURNIQUET CUFF) IMPLANT
DRAPE INCISE IOBAN 66X45 STRL (DRAPES) IMPLANT
DRAPE ORTHO SPLIT 77X108 STRL (DRAPES) ×2
DRAPE SURG ORHT 6 SPLT 77X108 (DRAPES) ×2 IMPLANT
DRAPE U-SHAPE 47X51 STRL (DRAPES) ×2 IMPLANT
DRSG ADAPTIC 3X8 NADH LF (GAUZE/BANDAGES/DRESSINGS) ×2 IMPLANT
DRSG PAD ABDOMINAL 8X10 ST (GAUZE/BANDAGES/DRESSINGS) ×4 IMPLANT
DURAPREP 26ML APPLICATOR (WOUND CARE) ×2 IMPLANT
ELECT REM PT RETURN 9FT ADLT (ELECTROSURGICAL) ×2
ELECTRODE REM PT RTRN 9FT ADLT (ELECTROSURGICAL) ×1 IMPLANT
EVACUATOR 1/8 PVC DRAIN (DRAIN) ×2 IMPLANT
FACESHIELD WRAPAROUND (MASK) ×4 IMPLANT
FLOSEAL 10ML (HEMOSTASIS) IMPLANT
GLOVE BIOGEL PI IND STRL 8 (GLOVE) ×4 IMPLANT
GLOVE BIOGEL PI INDICATOR 8 (GLOVE) ×4
GLOVE ORTHO TXT STRL SZ7.5 (GLOVE) ×6 IMPLANT
GLOVE SURG ORTHO 8.0 STRL STRW (GLOVE) ×6 IMPLANT
GOWN STRL REUS W/ TWL LRG LVL3 (GOWN DISPOSABLE) ×2 IMPLANT
GOWN STRL REUS W/ TWL XL LVL3 (GOWN DISPOSABLE) ×1 IMPLANT
GOWN STRL REUS W/TWL 2XL LVL3 (GOWN DISPOSABLE) ×2 IMPLANT
GOWN STRL REUS W/TWL LRG LVL3 (GOWN DISPOSABLE) ×2
GOWN STRL REUS W/TWL XL LVL3 (GOWN DISPOSABLE) ×1
HANDPIECE INTERPULSE COAX TIP (DISPOSABLE) ×1
HOOD PEEL AWAY FACE SHEILD DIS (HOOD) ×2 IMPLANT
IMMOBILIZER KNEE 22 (SOFTGOODS) ×2 IMPLANT
IMMOBILIZER KNEE 22 UNIV (SOFTGOODS) IMPLANT
KIT BASIN OR (CUSTOM PROCEDURE TRAY) ×2 IMPLANT
KIT ROOM TURNOVER OR (KITS) ×2 IMPLANT
MANIFOLD NEPTUNE II (INSTRUMENTS) ×2 IMPLANT
NEEDLE 22X1 1/2 (OR ONLY) (NEEDLE) IMPLANT
NS IRRIG 1000ML POUR BTL (IV SOLUTION) ×2 IMPLANT
PACK TOTAL JOINT (CUSTOM PROCEDURE TRAY) ×2 IMPLANT
PAD ABD 8X10 STRL (GAUZE/BANDAGES/DRESSINGS) ×4 IMPLANT
PAD ARMBOARD 7.5X6 YLW CONV (MISCELLANEOUS) ×4 IMPLANT
PAD CAST 4YDX4 CTTN HI CHSV (CAST SUPPLIES) ×1 IMPLANT
PADDING CAST COTTON 4X4 STRL (CAST SUPPLIES) ×1
PADDING CAST COTTON 6X4 STRL (CAST SUPPLIES) ×2 IMPLANT
SET HNDPC FAN SPRY TIP SCT (DISPOSABLE) ×1 IMPLANT
SPONGE GAUZE 4X4 12PLY (GAUZE/BANDAGES/DRESSINGS) ×2 IMPLANT
SPONGE GAUZE 4X4 12PLY STER LF (GAUZE/BANDAGES/DRESSINGS) ×2 IMPLANT
STAPLER VISISTAT 35W (STAPLE) ×2 IMPLANT
SUCTION FRAZIER TIP 10 FR DISP (SUCTIONS) ×2 IMPLANT
SUT ETHIBOND NAB CT1 #1 30IN (SUTURE) ×6 IMPLANT
SUT VIC AB 0 CT1 27 (SUTURE) ×1
SUT VIC AB 0 CT1 27XBRD ANBCTR (SUTURE) ×1 IMPLANT
SUT VIC AB 1 CT1 27 (SUTURE) ×2
SUT VIC AB 1 CT1 27XBRD ANBCTR (SUTURE) ×2 IMPLANT
SUT VIC AB 2-0 CT1 27 (SUTURE) ×2
SUT VIC AB 2-0 CT1 TAPERPNT 27 (SUTURE) ×2 IMPLANT
SYR CONTROL 10ML LL (SYRINGE) IMPLANT
TOWEL OR 17X24 6PK STRL BLUE (TOWEL DISPOSABLE) ×2 IMPLANT
TOWEL OR 17X26 10 PK STRL BLUE (TOWEL DISPOSABLE) ×2 IMPLANT
TRAY FOLEY CATH 16FRSI W/METER (SET/KITS/TRAYS/PACK) IMPLANT
WATER STERILE IRR 1000ML POUR (IV SOLUTION) ×4 IMPLANT

## 2013-05-01 NOTE — Transfer of Care (Signed)
Immediate Anesthesia Transfer of Care Note  Patient: Alan Swanson  Procedure(s) Performed: Procedure(s): RIGHT TOTAL KNEE ARTHROPLASTY (Right)  Patient Location: PACU  Anesthesia Type:General and GA combined with regional for post-op pain  Level of Consciousness: awake, alert , oriented and patient cooperative  Airway & Oxygen Therapy: Patient Spontanous Breathing and Patient connected to nasal cannula oxygen  Post-op Assessment: Report given to PACU RN, Post -op Vital signs reviewed and stable and Patient moving all extremities  Post vital signs: Reviewed and stable  Complications: No apparent anesthesia complications

## 2013-05-01 NOTE — Anesthesia Procedure Notes (Signed)
Anesthesia Regional Block:  Femoral nerve block  Pre-Anesthetic Checklist: ,, timeout performed, Correct Patient, Correct Site, Correct Laterality, Correct Procedure, Correct Position, site marked, Risks and benefits discussed,  Surgical consent,  Pre-op evaluation,  At surgeon's request and post-op pain management  Laterality: Right  Prep: chloraprep       Needles:   Needle Type: Echogenic Stimulator Needle     Needle Length: 9cm 9 cm Needle Gauge: 22 and 22 G    Additional Needles:  Procedures: nerve stimulator Femoral nerve block  Nerve Stimulator or Paresthesia:  Response: 0.48 mA,   Additional Responses:   Narrative:  Start time: 05/01/2013 9:08 AM End time: 05/01/2013 9:16 AM Injection made incrementally with aspirations every 5 mL. Anesthesiologist: Dr Gypsy BalsamKasik  Additional Notes: [id number]: [id number] R FNB POP CHG prep, sterile tech #22 stim/echo needle with stim down to .48ma Multiple neg asp Marc .5% w/epi 1:200000 total 25cc+decadron 4mg  infil No compl Dr Gypsy BalsamKasik

## 2013-05-01 NOTE — Brief Op Note (Signed)
05/01/2013  12:51 PM  PATIENT:  Alan Swanson  58 y.o. male  PRE-OPERATIVE DIAGNOSIS:  OA RIGHT KNEE  POST-OPERATIVE DIAGNOSIS:  OA RIGHT KNEE  PROCEDURE:  Procedure(s): RIGHT TOTAL KNEE ARTHROPLASTY (Right)  SURGEON:  Surgeon(s) and Role:    * W D Carloyn Manneraffrey Jr., MD - Primary  PHYSICIAN ASSISTANT: Margart SicklesJoshua Shatoria [place], PA-C  ASSISTANTS:   ANESTHESIA:   regional and general  EBL:  Total I/O In: 1600 [I.V.:1600] Out: 100 [Blood:100]  BLOOD ADMINISTERED:none  DRAINS: 1 hemovac drain lateral right knee self suction   LOCAL MEDICATIONS USED:  NONE  SPECIMEN:  No Specimen  DISPOSITION OF SPECIMEN:  N/A  COUNTS:  YES  TOURNIQUET:   Total Tourniquet Time Documented: Thigh (Right) - 71 minutes Total: Thigh (Right) - 71 minutes   DICTATION: .Other Dictation: Dictation Number unknown  PLAN OF CARE: Admit to inpatient   PATIENT DISPOSITION:  PACU - hemodynamically stable.   Delay start of Pharmacological VTE agent (>24hrs) due to surgical blood loss or risk of bleeding: yes

## 2013-05-01 NOTE — Care Management Note (Signed)
Utilization review completed. Kendy Haston, RN BSN Case Manager 

## 2013-05-01 NOTE — H&P (View-Only) (Signed)
Anesthesia Chart Review:  Patient is a 57 year old male scheduled for right TKA on 04/24/13 by Dr. Caffrey.  History includes non-smoker, SVT (treated with adenosine '04) on b-blocker therapy, chest pain in 2006 with negative stress test, mild chronic CPK elevation, HTN, GERD, anxiety disorder, OSA, obesity. PCP is Dr. Terry Daniel.  He referred patient to cardiologist Dr. Katz for a preoperative evaluation who cleared him without need for additional pre-operative cardiac testing.    EKG on 03/24/13 showed NSR.  Additional diagnostic testing pending his PAT evaluation.   Everette Dimauro, PA-C [facility]/Anesthesiology Phone [phone] 04/16/2013 4:33 PM    

## 2013-05-01 NOTE — Anesthesia Postprocedure Evaluation (Signed)
  Anesthesia Post-op Note  Patient: Alan Swanson  Procedure(s) Performed: Procedure(s): RIGHT TOTAL KNEE ARTHROPLASTY (Right)  Patient Location: PACU  Anesthesia Type:GA combined with regional for post-op pain  Level of Consciousness: awake  Airway and Oxygen Therapy: Patient Spontanous Breathing  Post-op Pain: mild  Post-op Assessment: Post-op Vital signs reviewed, Patient's Cardiovascular Status Stable, Respiratory Function Stable, Patent Airway, No signs of Nausea or vomiting and Pain level controlled  Post-op Vital Signs: Reviewed and stable  Last Vitals:  Filed Vitals:   05/01/13 1300  BP:   Pulse: 76  Temp:   Resp: 13    Complications: No apparent anesthesia complications

## 2013-05-01 NOTE — Interval H&P Note (Signed)
History and Physical Interval Note:  05/01/2013 9:27 AM  Alan Swanson  has presented today for surgery, with the diagnosis of OA RIGHT KNEE  The various methods of treatment have been discussed with the patient and family. After consideration of risks, benefits and other options for treatment, the patient has consented to  Procedure(s): RIGHT TOTAL KNEE ARTHROPLASTY (Right) as a surgical intervention .  The patient's history has been reviewed, patient examined, no change in status, stable for surgery.  I have reviewed the patient's chart and labs.  Questions were answered to the patient's satisfaction.     Thera FlakeW D Jann Milkovich Jr.

## 2013-05-01 NOTE — Anesthesia Preprocedure Evaluation (Signed)
Anesthesia Evaluation  Patient identified by MRN, date of birth, ID band Patient awake    Reviewed: Allergy & Precautions, H&P , NPO status , Patient's Chart, lab work & pertinent test results  History of Anesthesia Complications (+) PONV  Airway  TM Distance: >3 FB Neck ROM: Full    Dental   Pulmonary shortness of breath, sleep apnea ,  breath sounds clear to auscultation        Cardiovascular hypertension, + dysrhythmias Supra Ventricular Tachycardia Rhythm:Regular Rate:Normal     Neuro/Psych Anxiety Depression    GI/Hepatic GERD-  ,  Endo/Other    Renal/GU Renal InsufficiencyRenal disease     Musculoskeletal   Abdominal (+) + obese,   Peds  Hematology   Anesthesia Other Findings   Reproductive/Obstetrics                           Anesthesia Physical Anesthesia Plan  ASA: III  Anesthesia Plan: General   Post-op Pain Management:    Induction: Intravenous  Airway Management Planned: Oral ETT  Additional Equipment:   Intra-op Plan:   Post-operative Plan: Extubation in OR  Informed Consent: I have reviewed the patients History and Physical, chart, labs and discussed the procedure including the risks, benefits and alternatives for the proposed anesthesia with the patient or authorized representative who has indicated his/her understanding and acceptance.     Plan Discussed with: CRNA and Surgeon  Anesthesia Plan Comments:         Anesthesia Quick Evaluation

## 2013-05-01 NOTE — Evaluation (Signed)
Physical Therapy Evaluation Patient Details Name: Jaylenn Altier MRN: [id number] DOB: 1955-03-09 Today's Date: 05/01/2013   History of Present Illness  Patient is a 58 yo male s/p Rt TKA.  Clinical Impression  Patient presents with problems listed below.  Will benefit from acute PT to maximize independence prior to discharge home with wife.    Follow Up Recommendations Home health PT;Supervision/Assistance - 24 hour    Equipment Recommendations  None recommended by PT    Recommendations for Other Services       Precautions / Restrictions Precautions Precautions: Knee Precaution Booklet Issued: Yes (comment) Precaution Comments: Reviewed precautions with patient and wife. Required Braces or Orthoses: Knee Immobilizer - Right Knee Immobilizer - Right: On when out of bed or walking Restrictions Weight Bearing Restrictions: Yes RLE Weight Bearing: Weight bearing as tolerated      Mobility  Bed Mobility Overal bed mobility: Needs Assistance Bed Mobility: Supine to Sit     Supine to sit: Min assist     General bed mobility comments: Instructed patient on donning KI on RLE.  Verbal cues for bed mobility.  Assist to move RLE off of bed.  Good sitting balance.  Transfers Overall transfer level: Needs assistance Equipment used: Rolling walker (2 wheeled) Transfers: Sit to/from Stand Sit to Stand: Min assist         General transfer comment: Verbal cues for hand placement and technique.  Assist to rise to standing and for balance.    Ambulation/Gait Ambulation/Gait assistance: Min guard Ambulation Distance (Feet): 20 Feet Assistive device: Rolling walker (2 wheeled) Gait Pattern/deviations: Step-to pattern;Decreased stance time - right;Decreased step length - left;Decreased weight shift to right;Antalgic;Trunk flexed Gait velocity: Slow Gait velocity interpretation: Below normal speed for age/gender General Gait Details: Verbal cues for safe use of RW and gait  sequence.  Assist for safety.  Stairs            Wheelchair Mobility    Modified Rankin (Stroke Patients Only)       Balance                                             Pertinent Vitals/Pain Pain 7/10 with mobility.    Home Living Family/patient expects to be discharged to:: Private residence Living Arrangements: Spouse/significant other Available Help at Discharge: Family;Available 24 hours/day (wife and sister) Type of Home: House Home Access: Level entry     Home Layout: One level Home Equipment: Walker - 2 wheels;Bedside commode;Shower seat      Prior Function Level of Independence: Independent               Hand Dominance        Extremity/Trunk Assessment   Upper Extremity Assessment: Overall WFL for tasks assessed           Lower Extremity Assessment: RLE deficits/detail RLE Deficits / Details: Decreased strength and ROM due to surgery/pain.  Able to assist with moving RLE off of bed.    Cervical / Trunk Assessment: Normal  Communication   Communication: HOH (Lt ear - HOH)  Cognition Arousal/Alertness: Awake/alert Behavior During Therapy: WFL for tasks assessed/performed Overall Cognitive Status: Within Functional Limits for tasks assessed                      General Comments      Exercises Total Joint Exercises  Ankle Circles/Pumps: AROM;Both;10 reps;Seated      Assessment/Plan    PT Assessment Patient needs continued PT services  PT Diagnosis Difficulty walking;Acute pain   PT Problem List Decreased strength;Decreased range of motion;Decreased activity tolerance;Decreased mobility;Decreased balance;Decreased knowledge of use of DME;Decreased knowledge of precautions;Pain  PT Treatment Interventions DME instruction;Gait training;Functional mobility training;Therapeutic exercise;Patient/family education   PT Goals (Current goals can be found in the Care Plan section) Acute Rehab PT Goals Patient  Stated Goal: To return home PT Goal Formulation: With patient/family Time For Goal Achievement: 05/08/13 Potential to Achieve Goals: Good    Frequency 7X/week   Barriers to discharge        Co-evaluation               End of Session Equipment Utilized During Treatment: Gait belt;Right knee immobilizer Activity Tolerance: Patient limited by pain Patient left: in chair;with call bell/phone within reach;with family/visitor present Nurse Communication: Mobility status (Request for meal/food)         Time: 1710-1738 PT Time Calculation (min): 28 min   Charges:   PT Evaluation $Initial PT Evaluation Tier I: 1 Procedure PT Treatments $Gait Training: 8-22 mins $Therapeutic Activity: 8-22 mins   PT G Codes:          Vena AustriaSusan H Keeven Matty 05/01/2013, 6:54 PM Durenda HurtSusan H. Renaldo Fiddleravis, PT, [facility] Acute Rehab Services Pager [phone]

## 2013-05-01 NOTE — Progress Notes (Signed)
Orthopedic Tech Progress Note Patient Details:  Alan Swanson 08/15/1955 [id number]  CPM Right Knee CPM Right Knee: On Right Knee Flexion (Degrees): 60 Right Knee Extension (Degrees): 0 Additional Comments: Trapeze bar   Mickie BailJennifer Carol Cammer 05/01/2013, 2:06 PM

## 2013-05-01 NOTE — Care Management Note (Signed)
CARE MANAGEMENT NOTE 05/01/2013  Patient:  Alan Swanson,Alan Swanson   Account Number:  [id number]  Date Initiated:  05/01/2013  Documentation initiated by:  Vance PeperBRADY,Alan Swanson  Subjective/Objective Assessment:   58 yr old male s/p right total knee arthroplasty.     Action/Plan:   PT/OT Eval  Patient is under worker's comp.[place]. [phone]   Anticipated DC Date:  05/03/2013   Anticipated DC Plan:  HOME W HOME HEALTH SERVICES         Choice offered to / List presented to:             Status of service:  In process, will continue to follow

## 2013-05-01 NOTE — Discharge Instructions (Signed)
Diet: As you were doing prior to hospitalization  ° °Activity: Increase activity slowly as tolerated  °No lifting or driving for 6 weeks  ° °Shower: May shower without a dressing on post op day #5, NO SOAKING in tub  ° °Dressing: You may change your dressing on post op day #3.  °Then change the dressing daily with sterile 4"x4"s gauze dressing  °And TED hose for knees. Use paper tape to hold dressing in place  °For hips. You may clean the incision with alcohol prior to redressing.  ° °Weight Bearing: weight bearing as taught in physical therapy. Use a walker or  °Crutches as instructed.  ° °To prevent constipation: you may use a stool softener such as -  °Colace ( over the counter) 100 mg by mouth twice a day  °Drink plenty of fluids ( prune juice may be helpful) and high fiber foods  °Miralax ( over the counter) for constipation as needed.  ° °Precautions: If you experience chest pain or shortness of breath - call 911 immediately For transfer to the hospital emergency department!!  °If you develop a fever greater that 101 F, purulent drainage from wound, increased redness or drainage from wound, or calf pain -- Call the office  ° °Follow- Up Appointment: Please call for an appointment to be seen in 2 weeks  °[place] - [phone] ° °

## 2013-05-02 LAB — BASIC METABOLIC PANEL
BUN: 26 mg/dL — ABNORMAL HIGH (ref 6–23)
CHLORIDE: 95 meq/L — AB (ref 96–112)
CO2: 26 mEq/L (ref 19–32)
Calcium: 9.3 mg/dL (ref 8.4–10.5)
Creatinine, Ser: 1.13 mg/dL (ref 0.50–1.35)
GFR calc Af Amer: 82 mL/min — ABNORMAL LOW (ref >90)
GFR calc non Af Amer: 71 mL/min — ABNORMAL LOW (ref >90)
Glucose, Bld: 130 mg/dL — ABNORMAL HIGH (ref 70–99)
Potassium: 4.6 mEq/L (ref 3.7–5.3)
Sodium: 135 mEq/L — ABNORMAL LOW (ref 137–147)

## 2013-05-02 LAB — CBC
HCT: 39.2 % (ref 39.0–52.0)
Hemoglobin: 13.2 g/dL (ref 13.0–17.0)
MCH: 29.9 pg (ref 26.0–34.0)
MCHC: 33.7 g/dL (ref 30.0–36.0)
MCV: 88.7 fL (ref 78.0–100.0)
PLATELETS: 189 10*3/uL (ref 150–400)
RBC: 4.42 MIL/uL (ref 4.22–5.81)
RDW: 13.8 % (ref 11.5–15.5)
WBC: 22.5 10*3/uL — AB (ref 4.0–10.5)

## 2013-05-02 LAB — GLUCOSE, CAPILLARY: GLUCOSE-CAPILLARY: 137 mg/dL — AB (ref 70–99)

## 2013-05-02 NOTE — Progress Notes (Signed)
Patient states that his expectations are to have nursing staff bring analgesic medications when due. Education of prn medication unsuccessful, pt expectations remain as previously stated.

## 2013-05-02 NOTE — Progress Notes (Signed)
Physical Therapy Treatment Patient Details Name: Alan Swanson MRN: [id number] DOB: 04/02/1955 Today's Date: 05/02/2013    History of Present Illness Patient is a 58 yo male s/p Rt TKA.    PT Comments    Pt able to increase ambulation distance with improved overall gait sequence.  Pt was left on CPM 0-65 degrees.   Follow Up Recommendations  Home health PT;Supervision/Assistance - 24 hour     Equipment Recommendations  None recommended by PT    Recommendations for Other Services       Precautions / Restrictions Precautions Precautions: Knee Precaution Comments: Reviewed precautions with pt Required Braces or Orthoses: Knee Immobilizer - Right Knee Immobilizer - Right: On when out of bed or walking Restrictions Weight Bearing Restrictions: Yes RLE Weight Bearing: Weight bearing as tolerated    Mobility  Bed Mobility Overal bed mobility: Needs Assistance Bed Mobility: Supine to Sit;Sit to Supine     Supine to sit: Supervision Sit to supine: Modified independent (Device/Increase time)   General bed mobility comments: had pt practice without trapeze/rails  Transfers Overall transfer level: Needs assistance Equipment used: Rolling walker (2 wheeled) Transfers: Sit to/from Stand Sit to Stand: Supervision            Ambulation/Gait Ambulation/Gait assistance: Min guard Ambulation Distance (Feet): 250 Feet Assistive device: Rolling walker (2 wheeled) Gait Pattern/deviations: Step-through pattern;Decreased stance time - right Gait velocity: Slow       Stairs            Wheelchair Mobility    Modified Rankin (Stroke Patients Only)       Balance                                    Cognition Arousal/Alertness: Awake/alert Behavior During Therapy: WFL for tasks assessed/performed Overall Cognitive Status: Within Functional Limits for tasks assessed                      Exercises      General Comments         Pertinent Vitals/Pain 4/10 right knee pain    Home Living Family/patient expects to be discharged to:: Private residence Living Arrangements: Spouse/significant other Available Help at Discharge: Family;Available 24 hours/day (wife and sister) Type of Home: House Home Access: Level entry   Home Layout: One level Home Equipment: Walker - 2 wheels;Bedside commode;Shower seat;Adaptive equipment      Prior Function Level of Independence: Independent          PT Goals (current goals can now be found in the care plan section) Acute Rehab PT Goals Patient Stated Goal: To return home PT Goal Formulation: With patient/family Time For Goal Achievement: 05/08/13 Potential to Achieve Goals: Good Progress towards PT goals: Progressing toward goals    Frequency  7X/week    PT Plan Current plan remains appropriate    Co-evaluation             End of Session Equipment Utilized During Treatment: Gait belt Activity Tolerance: Patient tolerated treatment well Patient left: in CPM;in bed;with call bell/phone within reach     Time: 1341-1405 PT Time Calculation (min): 24 min  Charges:  $Gait Training: 8-22 mins $Therapeutic Activity: 8-22 mins                    G CodesRudy Swanson:      Alan Swanson 05/02/2013, 3:56 PM

## 2013-05-02 NOTE — Progress Notes (Signed)
Physical Therapy Treatment Patient Details Name: Alan Swanson MRN: [id number] DOB: 07/26/1955 Today's Date: 05/02/2013    History of Present Illness Patient is a 58 yo male s/p Rt TKA.    PT Comments    Pt able to increase ambulation distance this session with less pain noted.   Follow Up Recommendations  Home health PT;Supervision/Assistance - 24 hour     Equipment Recommendations  None recommended by PT    Recommendations for Other Services       Precautions / Restrictions Precautions Precautions: Knee Precaution Booklet Issued: Yes (comment) Precaution Comments: Reviewed precautions with patient and wife. Required Braces or Orthoses: Knee Immobilizer - Right Knee Immobilizer - Right: On when out of bed or walking Restrictions Weight Bearing Restrictions: Yes RLE Weight Bearing: Weight bearing as tolerated    Mobility  Bed Mobility                  Transfers Overall transfer level: Needs assistance Equipment used: Rolling walker (2 wheeled) Transfers: Sit to/from Stand Sit to Stand: Min guard         General transfer comment: VCs for hand and LE placement.   Ambulation/Gait Ambulation/Gait assistance: Min guard Ambulation Distance (Feet): 200 Feet Assistive device: Rolling walker (2 wheeled) Gait Pattern/deviations: Step-to pattern;Decreased stance time - right;Antalgic Gait velocity: Slow   General Gait Details: Verbal cues for safe use of RW and gait sequence.   Stairs            Wheelchair Mobility    Modified Rankin (Stroke Patients Only)       Cognition Arousal/Alertness: Awake/alert Behavior During Therapy: WFL for tasks assessed/performed Overall Cognitive Status: Within Functional Limits for tasks assessed                      Exercises Total Joint Exercises Ankle Circles/Pumps: AROM;Both;10 reps;Seated Quad Sets: AAROM;Strengthening;Right;10 reps;Seated Heel Slides: AAROM;10 reps;Right;Seated      Pertinent Vitals/Pain 5/10 right knee pain; premedicated     PT Goals (current goals can now be found in the care plan section) Acute Rehab PT Goals Patient Stated Goal: To return home PT Goal Formulation: With patient/family Time For Goal Achievement: 05/08/13 Potential to Achieve Goals: Good Progress towards PT goals: Progressing toward goals    Frequency  7X/week    PT Plan Current plan remains appropriate    Co-evaluation             End of Session Equipment Utilized During Treatment: Gait belt;Right knee immobilizer Activity Tolerance: Patient tolerated treatment well Patient left: in chair;with call bell/phone within reach;with family/visitor present     Time: 0824-0902 PT Time Calculation (min): 38 min  Charges:  $Gait Training: 23-37 mins $Therapeutic Exercise: 8-22 mins                    G CodesRudy Swanson:      Alan Swanson 05/02/2013, 9:54 AM  Alan Swanson, PT DPT [phone]

## 2013-05-02 NOTE — Progress Notes (Signed)
Orthopaedic Trauma Service (OTS)  Subjective: 1 Day Post-Op Procedure(s) (LRB): RIGHT TOTAL KNEE ARTHROPLASTY (Right) Patient reports pain as moderate.   Doing fantastic and has already worked with PT, OT this am.  Sitting in chair at bedside.  Objective: Current Vitals Blood pressure 152/68, pulse 74, temperature 97.9 F (36.6 C), resp. rate 18, height 5' 9.5" (1.765 m), weight 250 lb (113.399 kg), SpO2 99.00%. Vital signs in last 24 hours: Temp:  [97.3 F (36.3 C)-97.9 F (36.6 C)] 97.9 F (36.6 C) (04/11 0704) Pulse Rate:  [63-97] 74 (04/11 0704) Resp:  [10-21] 18 (04/11 0704) BP: (106-158)/(38-120) 152/68 mmHg (04/11 0704) SpO2:  [91 %-100 %] 99 % (04/11 0704)  Intake/Output from previous day: 04/10 0701 - 04/11 0700 In: 2047 [P.O.:222; I.V.:1825] Out: 1000 [Urine:250; Drains:650; Blood:100]  LABS  Recent Labs  05/01/13 1655  HGB 14.4    Recent Labs  05/01/13 1655  WBC 19.3*  RBC 4.87  HCT 42.7  PLT 188    Recent Labs  05/01/13 1655  CREATININE 1.16   No results found for this basename: LABPT, INR,  in the last 72 hours  Physical Exam  LLE Dressing clean, dry, intact  Drain functioning  Sens DPN, SPN, TN intact  Motor EHL, ext, flex, evers 5/5  DP 2+, No significant edema    Imaging No results found.  Assessment/Plan: 1 Day Post-Op Procedure(s) (LRB): RIGHT TOTAL KNEE ARTHROPLASTY (Right) Progressing exceedingly well Large drain output but now slowing significantly WBAT Lovenox D/c planning D/c drain tomorrow Awaiting labs; apparently patient in bathroom when phlebotomist came to draw this am  Myrene GalasMichael Gaelen Brager, MD [facility] [phone] (p)   05/02/2013, 9:18 AM

## 2013-05-02 NOTE — Evaluation (Signed)
Occupational Therapy Evaluation Patient Details Name: Alan Swanson MRN: [id number] DOB: 10/16/1955 Today's Date: 05/02/2013    History of Present Illness Patient is a 58 yo male s/p Rt TKA.   Clinical Impression   Pt moving well during session. Education provided to pt and spouse (friend also present). Feel pt is safe to d/c home, from OT standpoint.     Follow Up Recommendations  No OT follow up;Supervision - Intermittent    Equipment Recommendations  None recommended by OT    Recommendations for Other Services       Precautions / Restrictions Precautions Precautions: Knee Precaution Comments: Reviewed precautions with pt Required Braces or Orthoses: Knee Immobilizer - Right Knee Immobilizer - Right: On when out of bed or walking Restrictions Weight Bearing Restrictions: Yes RLE Weight Bearing: Weight bearing as tolerated      Mobility Bed Mobility Overal bed mobility: Needs Assistance Bed Mobility: Supine to Sit;Sit to Supine     Supine to sit: Supervision Sit to supine: Modified independent (Device/Increase time)   General bed mobility comments: had pt practice without trapeze/rails  Transfers Overall transfer level: Needs assistance Equipment used: Rolling walker (2 wheeled) Transfers: Sit to/from Stand Sit to Stand: Supervision              Balance                                            ADL Overall ADL's : Needs assistance/impaired     Grooming: Wash/dry hands;Supervision/safety;Standing               Lower Body Dressing: With adaptive equipment;Sit to/from stand;Set up;Supervision/safety   Toilet Transfer: Supervision/safety;Ambulation;Comfort height toilet;Regular Toilet;Grab bars           Functional mobility during ADLs: Supervision/safety;Rolling walker-without knee immobilizer General ADL Comments: Recommended sitting for dressing and bathing. Discussed use of bag on walker to carry items. Educated on  benefit of reaching to doff right sock as it increases ROM in knee. Reviewed tub transfer technique-pt verbalized he feels good about this and did this technique before. Recommended having rugs picked up in house (non skid in bathroom). Discussed safe shoe wear. Practiced on simuated regular height commode surface with grab bar (simulate sink at home). Pt practiced with reacher and sockaid. Recommended having wife with him for tub transfer.     Vision                     Perception     Praxis      Pertinent Vitals/Pain Pain 6/10. Repositioned. Rolled blanket placed under ankle.     Hand Dominance     Extremity/Trunk Assessment Upper Extremity Assessment Upper Extremity Assessment: Overall WFL for tasks assessed   Lower Extremity Assessment Lower Extremity Assessment: Defer to PT evaluation       Communication Communication Communication: No difficulties   Cognition Arousal/Alertness: Awake/alert Behavior During Therapy: WFL for tasks assessed/performed Overall Cognitive Status: Within Functional Limits for tasks assessed                     General Comments       Exercises       Shoulder Instructions      Home Living Family/patient expects to be discharged to:: Private residence Living Arrangements: Spouse/significant other Available Help at Discharge: Family;Available 24 hours/day (wife and sister)  Type of Home: House Home Access: Level entry     Home Layout: One level     Bathroom Shower/Tub: Tub/shower unit;Door Shower/tub characteristics: Door [employer]: Standard Bathroom Accessibility: Yes How Accessible: Accessible via walker Home Equipment: Walker - 2 wheels;Bedside commode;Shower seat;Adaptive equipment Adaptive Equipment: Reacher Risk manager)        Prior Functioning/Environment Level of Independence: Independent             OT Diagnosis:     OT Problem List:     OT Treatment/Interventions:      OT  Goals(Current goals can be found in the care plan section)    OT Frequency:     Barriers to D/C:            Co-evaluation              End of Session Equipment Utilized During Treatment: Gait belt;Rolling walker CPM Right Knee CPM Right Knee: Off  Activity Tolerance: Patient tolerated treatment well Patient left: in chair;with call bell/phone within reach;with family/visitor present   Time: 9147-8295 OT Time Calculation (min): 29 min Charges:  OT General Charges $OT Visit: 1 Procedure OT Evaluation $Initial OT Evaluation Tier I: 1 Procedure OT Treatments $Self Care/Home Management : 8-22 mins G-Codes:    Earlie Raveling OTR/L [phone] 05/02/2013, 3:23 PM

## 2013-05-02 NOTE — Op Note (Signed)
NAMClayton Swanson:  [name], [name]                ACCOUNT NO.:  [id number]  MEDICAL RECORD NO.:  [id number]  LOCATION:  [id number]                        FACILITY:  [facility]  PHYSICIAN:  Dyke BrackettW. D. Nikeshia Keetch, M.D.    DATE OF BIRTH:  12-14-1955  DATE OF PROCEDURE: DATE OF DISCHARGE:                              OPERATIVE REPORT   PREOPERATIVE DIAGNOSIS:  Osteoarthritis, right knee.  POSTOPERATIVE DIAGNOSIS:  Osteoarthritis, right knee.  OPERATION:  Right total knee replacement with Sigma cemented knee (size 4 tibia, femur 10 mm bearing with 38 mm all poly patella).  SURGEON:  Dyke BrackettW. D. Yaneisy Wenz, M.D.  ASSISTANT:  Margart SicklesJoshua Chadwell, PA-C.  TOURNIQUET TIME:  70 minutes.  ANESTHESIA:  General with femoral nerve block.  DESCRIPTION OF PROCEDURE:  Sterile prep and drape, exsanguination of leg, inflation to 350.  Straight skin incision was made with medial parapatellar approach to the knee made.  We identified the distal femur cut at 11 mm, thought to be valgus cut.  We then cut about 3-4 mm below the most diseased medial compartment, wear pattern of the __________ in the knee was consistent with mainly a medial compartment process, although there was moderate involvement of the patellofemoral joint. Extension gap was measured at 10 mm.  We then sized the femur to be a size 4 and then placed the appropriate alignment rod to set the rotation of the anterior-posterior and chamfer cuts guide with appropriate external rotation.  We then completed these cuts.  Excess menisci and posterior clean-out was made in the posterior aspect of the knee with complete release of the PCL.  The flexion gap measured at 10 mm with deeper to the extension gap with the trial.  Kevin FentonKeyhole was cut for the tibia and then the patella was cut leaving about 17 mm of native patella for 38 mm patellar trial.  Once, we trialed things, I felt that the knee lacks a slight amount of terminal extension, was slightly tight in flexion.  Therefore, we did a  revision tibial cut, cut an additional 2 mm off the tibia both affecting the flexion and extension gap, which corrected the issues that I just mentioned.  Trials were again placed, all parameters deemed to be acceptable.  Excellent stability was noted. Good balance seen.  The final components were inserted, tibia followed by femur patella, trial bearing was used.  Cement was allowed to harden, the trial was removed.  Excess cement was noted in the posterior aspect of the knee. Tourniquet was released.  No excess bleeding was noted small bleeders were coagulated.  Hemovac drain was placed __________ superolaterally.  Closure was affected with #1 Ethibond, 0 and 2-0 Vicryl, skin clips, taken to recovery room in stable condition.     Dyke BrackettW. D. Gabrielle Mester, M.D.     WDC/MEDQ  D:  05/01/2013  T:  05/01/2013  Job:  [id number]

## 2013-05-02 NOTE — Progress Notes (Signed)
Orthopedic Tech Progress Note Patient Details:  Volney PresserRodger Lee Budhu 07/11/1955 [id number] Patient working with PT at this time. No CPM application for 1500 rounds. CPM Right Knee CPM Right Knee: Off Right Knee Flexion (Degrees): 65 Right Knee Extension (Degrees): 0 Additional Comments: Trapeze bar   Asia R Thompson 05/02/2013, 3:08 PM

## 2013-05-03 LAB — CBC
HEMATOCRIT: 37.7 % — AB (ref 39.0–52.0)
HEMOGLOBIN: 12.4 g/dL — AB (ref 13.0–17.0)
MCH: 29.3 pg (ref 26.0–34.0)
MCHC: 32.9 g/dL (ref 30.0–36.0)
MCV: 89.1 fL (ref 78.0–100.0)
Platelets: 171 10*3/uL (ref 150–400)
RBC: 4.23 MIL/uL (ref 4.22–5.81)
RDW: 14.1 % (ref 11.5–15.5)
WBC: 18.2 10*3/uL — ABNORMAL HIGH (ref 4.0–10.5)

## 2013-05-03 NOTE — Progress Notes (Signed)
Physical Therapy Treatment Patient Details Name: Alan Swanson MRN: [id number] DOB: 07/24/1955 Today's Date: 05/03/2013    History of Present Illness Patient is a 58 yo male s/p Rt TKA.    PT Comments    More painful this pm, but still moving well; on track for dc home tomorrow  Follow Up Recommendations  Home health PT;Supervision/Assistance - 24 hour     Equipment Recommendations  None recommended by PT    Recommendations for Other Services       Precautions / Restrictions Precautions Precautions: Knee Precaution Comments: Reviewed precautions with pt Required Braces or Orthoses: Knee Immobilizer - Right Knee Immobilizer - Right: On when out of bed or walking Restrictions RLE Weight Bearing: Weight bearing as tolerated    Mobility  Bed Mobility                  Transfers Overall transfer level: Needs assistance Equipment used: Rolling walker (2 wheeled) Transfers: Sit to/from Stand Sit to Stand: Supervision         General transfer comment: VCs for hand and LE placement. Worked on control and R foot placement for closed chain flexion during functional activity  Ambulation/Gait Ambulation/Gait assistance: Min guard Ambulation Distance (Feet): 250 Feet Assistive device: Rolling walker (2 wheeled) Gait Pattern/deviations: Step-through pattern     General Gait Details: Walked without KI with good R knee stability in stance; Cues for knee control/quad setting in stance as pt tends to stand on slightly flexed knee   Stairs            Wheelchair Mobility    Modified Rankin (Stroke Patients Only)       Balance                                    Cognition Arousal/Alertness: Awake/alert Behavior During Therapy: WFL for tasks assessed/performed Overall Cognitive Status: Within Functional Limits for tasks assessed                      Exercises      General Comments        Pertinent Vitals/Pain 7/10 R  knee RN provided medication to assist with pain control     Home Living                      Prior Function            PT Goals (current goals can now be found in the care plan section) Acute Rehab PT Goals Patient Stated Goal: To return home PT Goal Formulation: With patient/family Time For Goal Achievement: 05/08/13 Potential to Achieve Goals: Good Progress towards PT goals: Progressing toward goals    Frequency  7X/week    PT Plan Current plan remains appropriate    Co-evaluation             End of Session   Activity Tolerance: Patient tolerated treatment well Patient left: in chair;with call bell/phone within reach     Time: 1530-1546 PT Time Calculation (min): 16 min  Charges:  $Gait Training: 8-22 mins                    G Codes:      [facility] Psolly Hamff [place]arr[place] 05/03/2013, 5:12 PM Van ClinesHolly Joachim Carton, PT  Acute Rehabilitation Services Pager [phone] Office [phone]

## 2013-05-03 NOTE — Progress Notes (Signed)
Physical Therapy Treatment Patient Details Name: Alan Swanson: [id number] DOB: 05/23/1955 Today's Date: 05/03/2013    History of Present Illness Patient is a 58 yo male s/p Rt TKA.    PT Comments    Continuing progress with functional mobility; Showing good R knee stance stability with amb; on track for dc home tomorrow  Follow Up Recommendations  Home health PT;Supervision/Assistance - 24 hour     Equipment Recommendations  None recommended by PT    Recommendations for Other Services       Precautions / Restrictions Precautions Precautions: Knee Precaution Comments: Reviewed precautions with pt Required Braces or Orthoses: Knee Immobilizer - Right Knee Immobilizer - Right: On when out of bed or walking Restrictions RLE Weight Bearing: Weight bearing as tolerated    Mobility  Bed Mobility                  Transfers Overall transfer level: Needs assistance Equipment used: Rolling walker (2 wheeled) Transfers: Sit to/from Stand Sit to Stand: Supervision         General transfer comment: VCs for hand and LE placement. Worked on control and R foot placement for closed chain flexion during functional activity  Ambulation/Gait Ambulation/Gait assistance: Min Control and instrumentation engineerguard;Supervision Ambulation Distance (Feet): 520 Feet Assistive device: Rolling walker (2 wheeled) Gait Pattern/deviations: Step-through pattern     General Gait Details: Walked without KI with good R knee stability in stance; Cues for knee control/quad setting in stance   Stairs            Wheelchair Mobility    Modified Rankin (Stroke Patients Only)       Balance                                    Cognition Arousal/Alertness: Awake/alert Behavior During Therapy: WFL for tasks assessed/performed Overall Cognitive Status: Within Functional Limits for tasks assessed                      Exercises Total Joint Exercises Quad Sets: AROM;Right;5  reps;Seated Long Arc Quad: AAROM;Right;10 reps;Seated Knee Flexion: AROM;Right;10 reps;Seated Goniometric ROM: 3-80deg    General Comments        Pertinent Vitals/Pain 6/10-7/10 pain R knee with amb patient repositioned for comfort and Optimal knee extension     Home Living                      Prior Function            PT Goals (current goals can now be found in the care plan section) Acute Rehab PT Goals Patient Stated Goal: To return home PT Goal Formulation: With patient/family Time For Goal Achievement: 05/08/13 Potential to Achieve Goals: Good Progress towards PT goals: Progressing toward goals    Frequency  7X/week    PT Plan Current plan remains appropriate    Co-evaluation             End of Session   Activity Tolerance: Patient tolerated treatment well Patient left: in chair;with call bell/phone within reach     Time: 0840-0905 PT Time Calculation (min): 25 min  Charges:  $Gait Training: 8-22 mins $Therapeutic Exercise: 8-22 mins                    G Codes:      [facility] [place] 05/03/2013, 9:51 AM  Jeanice LimHolly  Gregary Cromer, [place] Pager [phone] Office [phone]

## 2013-05-03 NOTE — Progress Notes (Signed)
Orthopaedic Trauma Service Progress Note  Subjective  Doing very well Pain controlled Sitting in chair No specific complaints     Objective   BP 103/53  Pulse 87  Temp(Src) 97.7 F (36.5 C) (Oral)  Resp 18  Ht 5' 9.5" (1.765 m)  Wt 113.399 kg (250 lb)  BMI 36.40 kg/m2  SpO2 98%  Intake/Output     04/11 0701 - 04/12 0700 04/12 0701 - 04/13 0700   P.O. 480    I.V. (mL/kg) 789.2 (7)    Total Intake(mL/kg) 1269.2 (11.2)    Urine (mL/kg/hr)     Drains 310 (0.1)    Blood     Total Output 310     Net +959.2          Urine Occurrence 1 x      Labs  Results for Alan Swanson, Alan Swanson (MRN [id number]) as of 05/03/2013 11:21  Ref. Range 05/03/2013 06:55  WBC Latest Range: 4.0-10.5 K/uL 18.2 (H)  RBC Latest Range: 4.22-5.81 MIL/uL 4.23  Hemoglobin Latest Range: 13.0-17.0 g/dL 40.912.4 (L)  HCT Latest Range: 39.0-52.0 % 37.7 (L)  MCV Latest Range: 78.0-100.0 fL 89.1  MCH Latest Range: 26.0-34.0 pg 29.3  MCHC Latest Range: 30.0-36.0 g/dL 81.132.9  RDW Latest Range: 11.5-15.5 % 14.1  Platelets Latest Range: 150-400 K/uL 171    Exam  Gen: awake and alert, NAD, appears well  Ext:       Right Lower Extremity   Incision looks excellent  No drainage  No signs of infection  Drain removed   Distal motor and sensory functions intact  Ext warm  Swelling well controlled  No DCT      Assessment and Plan   POD/HD#:    58 y/o male s/p R TKA  1. R TKA  Dressing changed today  Continue to work on getting full extension   No pillows under knee at rest  WBAT  CPM  Dc home tomorrow  Will get TED hose   2. DVT/PE prophylaxis  Lovenox  3. Pain   Continue with current regimen  4. FEN  Diet as tolerated  NSL  Dc IVF  5. Medical issues  Continue home meds  6. dispo  Dc home tomorrow   Alan LatinKeith W. Shanya Ferriss, PA-C [facility] [phone] (P) 05/03/2013 11:20 AM

## 2013-05-04 ENCOUNTER — Encounter (HOSPITAL_COMMUNITY): Payer: Self-pay | Admitting: Orthopedic Surgery

## 2013-05-04 LAB — CBC
HCT: 36.1 % — ABNORMAL LOW (ref 39.0–52.0)
Hemoglobin: 12 g/dL — ABNORMAL LOW (ref 13.0–17.0)
MCH: 29.5 pg (ref 26.0–34.0)
MCHC: 33.2 g/dL (ref 30.0–36.0)
MCV: 88.7 fL (ref 78.0–100.0)
Platelets: 145 10*3/uL — ABNORMAL LOW (ref 150–400)
RBC: 4.07 MIL/uL — ABNORMAL LOW (ref 4.22–5.81)
RDW: 14.1 % (ref 11.5–15.5)
WBC: 11.7 10*3/uL — AB (ref 4.0–10.5)

## 2013-05-04 MED ORDER — METHOCARBAMOL 500 MG PO TABS: 500.0000 mg | ORAL_TABLET | Freq: Four times a day (QID) | ORAL | Status: DC | PRN

## 2013-05-04 NOTE — Discharge Summary (Signed)
PATIENT ID: Alan PresserRodger Lee Hanlon        MRN:  [id number]          DOB/AGE: 57/05/1955 / 57 y.o.    DISCHARGE SUMMARY  ADMISSION DATE:    05/01/2013 DISCHARGE DATE:   05/04/2013   ADMISSION DIAGNOSIS: OA RIGHT KNEE    DISCHARGE DIAGNOSIS:  OA RIGHT KNEE    ADDITIONAL DIAGNOSIS: Active Problems:   Osteoarthritis of right knee  Past Medical History  Diagnosis Date  . Supraventricular tachycardia     successfully treated with adenosine,June 2004  . Chest pain     history of negative stress test,most recently November 2006,history of normal left ventricular function  . Hypertension   . GERD (gastroesophageal reflux disease)   . Obesity   . Elevated CPK     chronically elevated CPK'S  . Anxiety disorder   . Palpitations     with stress  . Ejection fraction   . PONV (postoperative nausea and vomiting)   . Dysrhythmia     tachy occ  . Sleep apnea     cpap use off and on      10 yrs  . Shortness of breath     "overwt"  . Anxiety   . Depression     no med in month rx for 2 months  . History of kidney stones   . Arthritis     PROCEDURE: Procedure(s): RIGHT TOTAL KNEE ARTHROPLASTY Right on 05/01/2013  CONSULTS: none     HISTORY:  See H&P in chart  HOSPITAL COURSE:  Alan Swanson is a [age] y.o. admitted on 05/01/2013 and found to have a diagnosis of OA RIGHT KNEE.  After appropriate laboratory studies were obtained  they were taken to the operating room on 05/01/2013 and underwent  Procedure(s): RIGHT TOTAL KNEE ARTHROPLASTY  Right.   They were given perioperative antibiotics:  Anti-infectives   Start     Dose/Rate Route Frequency Ordered Stop   05/01/13 1620  ceFAZolin (ANCEF) IVPB 2 g/50 mL premix     2 g 100 mL/hr over 30 Minutes Intravenous Every 6 hours 05/01/13 1523 05/01/13 2300   05/01/13 0600  ceFAZolin (ANCEF) IVPB 2 g/50 mL premix     2 g 100 mL/hr over 30 Minutes Intravenous On call to O.R. 04/30/13 1427 05/01/13 1050    .  Tolerated the procedure well.     POD #1, allowed out of bed to a chair.  PT for ambulation and exercise program.  IV saline locked.  O2 discontionued.  POD #2, continued PT and ambulation.  Hemovac pulled. .  The remainder of the hospital course was dedicated to ambulation and strengthening.   The patient was discharged on 3 Days Post-Op in  Stable condition.  Blood products given:none  DIAGNOSTIC STUDIES: Recent vital signs: Patient Vitals for the past 24 hrs:  BP Temp Temp src Pulse Resp SpO2  05/04/13 0309 98/54 mmHg 98.2 F (36.8 C) - 63 19 98 %  05/03/13 2255 109/56 mmHg 98 F (36.7 C) - 64 - 98 %  05/03/13 1558 105/55 mmHg 99.1 F (37.3 C) Oral 65 18 95 %  05/03/13 1546 - - - - 18 98 %  05/03/13 1153 - - - - 18 98 %  05/03/13 0955 103/53 mmHg - - 87 - -  05/03/13 0828 - - - - 18 98 %       Recent laboratory studies:  Recent Labs  05/01/13 1655 05/02/13 1142 05/03/13 [id number]  05/04/13 0440  WBC 19.3* 22.5* 18.2* 11.7*  HGB 14.4 13.2 12.4* 12.0*  HCT 42.7 39.2 37.7* 36.1*  PLT 188 189 171 145*    Recent Labs  05/01/13 1655 05/02/13 1142  NA  --  135*  K  --  4.6  CL  --  95*  CO2  --  26  BUN  --  26*  CREATININE 1.16 1.13  GLUCOSE  --  130*  CALCIUM  --  9.3   Lab Results  Component Value Date   INR 0.95 04/17/2013     Recent Radiographic Studies :  Dg Chest 2 View  04/17/2013   CLINICAL DATA:  Hypertension.  EXAM: CHEST  2 VIEW  COMPARISON:  06/28/2010  FINDINGS: Lungs are clear. Cardiomediastinal silhouette and remainder of the exam is unchanged.  IMPRESSION: No active cardiopulmonary disease.   Electronically Signed   By: Elberta Fortisaniel  Boyle M.D.   On: 04/17/2013 10:46    DISCHARGE INSTRUCTIONS:   DISCHARGE MEDICATIONS:     Medication List    STOP taking these medications       diclofenac 75 MG EC tablet  Commonly known as:  VOLTAREN     diclofenac sodium 1 % Gel  Commonly known as:  VOLTAREN     etodolac 400 MG tablet  Commonly known as:  LODINE      oxyCODONE-acetaminophen 5-325 MG per tablet  Commonly known as:  PERCOCET/ROXICET      TAKE these medications       acetaminophen 500 MG tablet  Commonly known as:  TYLENOL  Take 1,000 mg by mouth every 6 (six) hours as needed for mild pain.     enoxaparin 30 MG/0.3ML injection  Commonly known as:  LOVENOX  Inject 0.3 mLs (30 mg total) into the skin every 12 (twelve) hours.     lisinopril-hydrochlorothiazide 20-12.5 MG per tablet  Commonly known as:  PRINZIDE,ZESTORETIC  Take 1 tablet by mouth every morning.     loratadine 10 MG tablet  Commonly known as:  CLARITIN  Take 10 mg by mouth daily as needed for allergies.     metoprolol succinate 50 MG 24 hr tablet  Commonly known as:  TOPROL-XL  Take 50 mg by mouth daily.     omeprazole 20 MG capsule  Commonly known as:  PRILOSEC  Take 20 mg by mouth daily.     oxyCODONE 5 MG immediate release tablet  Commonly known as:  ROXICODONE  1-2 tabs po q4-6hrs prn pain        FOLLOW UP VISIT:       Follow-up Information   Follow up with CAFFREY JR,W D, MD. Schedule an appointment as soon as possible for a visit in 2 weeks.   Specialty:  Orthopedic Surgery   Contact information:   [address] [place] [address] [phone]       DISPOSITION:   Home  CONDITION:  Stable   Margart SicklesJoshua Wandell Scullion 05/04/2013, 8:07 AM

## 2013-05-04 NOTE — Care Management Note (Signed)
05/04/2013  Patient:  Alan Swanson,Alan Swanson   Account Number:  [id number]  Date Initiated:  05/01/2013  Documentation initiated by:  Vance PeperBRADY,Haiden Rawlinson  Subjective/Objective Assessment:   58 yr old male s/p right total knee arthroplasty.     Action/Plan:   PT/OT Eval  Patient is under worker's comp.[place]pt [place]. [phone]   Anticipated DC Date:  05/04/2013   Anticipated DC Plan:  HOME W HOME HEALTH SERVICES      DC Planning Services  CM consult      [facility]  [facility]  DURABLE MEDICAL EQUIPMENT   Choice offered to / List presented to:     DME arranged  3-N-1  [facility]  CPM      DME agency  [employer]     HH arranged  HH-2 PT      HH agency  [facility]  OTHER - SEE NOTE   Status of service:  Completed, signed off Medicare Important Message given?   (If response is "NO", the following Medicare IM given date fields will be blank) Date Medicare IM given:   Date Additional Medicare IM given:    Discharge Disposition:  HOME W HOME HEALTH SERVICES  Per UR Regulation:    If discussed at Long Length of Stay Meetings, dates discussed:    Comments:  05/04/13 Vance PeperSusan Judieth Mckown, RN BSN Case Manager Patient is under Worker's comp, Dept of laborunable to reach. Patient stated that his was informed by Dr.'s [facility]. CM contacted [facility] liasion, Hilda LiasMarie and she states that they will provide Home Health and work out the negotiation.

## 2013-05-06 NOTE — Progress Notes (Signed)
I saw and examined the patient with Mr. Paul, communicating the findings and plan noted above.  Lexia Vandevender, MD [facility] [phone] (p)  

## 2015-05-02 DIAGNOSIS — K080 Exfoliation of teeth due to systemic causes: Secondary | ICD-10-CM | POA: Diagnosis not present

## 2015-05-16 DIAGNOSIS — Z1211 Encounter for screening for malignant neoplasm of colon: Secondary | ICD-10-CM | POA: Diagnosis not present

## 2015-06-02 DIAGNOSIS — R002 Palpitations: Secondary | ICD-10-CM | POA: Diagnosis not present

## 2015-06-02 DIAGNOSIS — K219 Gastro-esophageal reflux disease without esophagitis: Secondary | ICD-10-CM | POA: Diagnosis not present

## 2015-06-02 DIAGNOSIS — I10 Essential (primary) hypertension: Secondary | ICD-10-CM | POA: Diagnosis not present

## 2015-06-02 DIAGNOSIS — R404 Transient alteration of awareness: Secondary | ICD-10-CM | POA: Diagnosis not present

## 2015-06-02 DIAGNOSIS — M1990 Unspecified osteoarthritis, unspecified site: Secondary | ICD-10-CM | POA: Diagnosis not present

## 2015-06-02 DIAGNOSIS — Z79899 Other long term (current) drug therapy: Secondary | ICD-10-CM | POA: Diagnosis not present

## 2015-06-02 DIAGNOSIS — R42 Dizziness and giddiness: Secondary | ICD-10-CM | POA: Diagnosis not present

## 2015-06-02 DIAGNOSIS — I480 Paroxysmal atrial fibrillation: Secondary | ICD-10-CM | POA: Diagnosis not present

## 2015-06-17 DIAGNOSIS — I471 Supraventricular tachycardia: Secondary | ICD-10-CM | POA: Diagnosis not present

## 2015-06-17 DIAGNOSIS — E876 Hypokalemia: Secondary | ICD-10-CM | POA: Diagnosis not present

## 2015-06-17 DIAGNOSIS — I10 Essential (primary) hypertension: Secondary | ICD-10-CM | POA: Diagnosis not present

## 2015-06-17 DIAGNOSIS — Z79899 Other long term (current) drug therapy: Secondary | ICD-10-CM | POA: Diagnosis not present

## 2015-06-17 DIAGNOSIS — M1990 Unspecified osteoarthritis, unspecified site: Secondary | ICD-10-CM | POA: Diagnosis not present

## 2015-06-17 DIAGNOSIS — R000 Tachycardia, unspecified: Secondary | ICD-10-CM | POA: Diagnosis not present

## 2015-06-17 DIAGNOSIS — K219 Gastro-esophageal reflux disease without esophagitis: Secondary | ICD-10-CM | POA: Diagnosis not present

## 2015-06-27 DIAGNOSIS — E782 Mixed hyperlipidemia: Secondary | ICD-10-CM | POA: Diagnosis not present

## 2015-06-27 DIAGNOSIS — I10 Essential (primary) hypertension: Secondary | ICD-10-CM | POA: Diagnosis not present

## 2015-06-27 DIAGNOSIS — Z0001 Encounter for general adult medical examination with abnormal findings: Secondary | ICD-10-CM | POA: Diagnosis not present

## 2015-06-27 DIAGNOSIS — Z9189 Other specified personal risk factors, not elsewhere classified: Secondary | ICD-10-CM | POA: Diagnosis not present

## 2015-07-04 DIAGNOSIS — E782 Mixed hyperlipidemia: Secondary | ICD-10-CM | POA: Diagnosis not present

## 2015-07-04 DIAGNOSIS — I10 Essential (primary) hypertension: Secondary | ICD-10-CM | POA: Diagnosis not present

## 2015-07-04 DIAGNOSIS — E6609 Other obesity due to excess calories: Secondary | ICD-10-CM | POA: Diagnosis not present

## 2015-07-04 DIAGNOSIS — Z0001 Encounter for general adult medical examination with abnormal findings: Secondary | ICD-10-CM | POA: Diagnosis not present

## 2015-07-04 DIAGNOSIS — E8881 Metabolic syndrome: Secondary | ICD-10-CM | POA: Diagnosis not present

## 2015-07-11 ENCOUNTER — Ambulatory Visit (INDEPENDENT_AMBULATORY_CARE_PROVIDER_SITE_OTHER): Payer: Federal, State, Local not specified - PPO | Admitting: Cardiology

## 2015-07-11 ENCOUNTER — Encounter: Payer: Self-pay | Admitting: Cardiology

## 2015-07-11 ENCOUNTER — Encounter: Payer: Self-pay | Admitting: *Deleted

## 2015-07-11 VITALS — BP 113/72 | HR 60 | Ht 70.0 in | Wt 263.2 lb

## 2015-07-11 DIAGNOSIS — I471 Supraventricular tachycardia: Secondary | ICD-10-CM | POA: Diagnosis not present

## 2015-07-11 DIAGNOSIS — G4733 Obstructive sleep apnea (adult) (pediatric): Secondary | ICD-10-CM

## 2015-07-11 DIAGNOSIS — Z9289 Personal history of other medical treatment: Secondary | ICD-10-CM | POA: Diagnosis not present

## 2015-07-11 DIAGNOSIS — I10 Essential (primary) hypertension: Secondary | ICD-10-CM

## 2015-07-11 NOTE — Progress Notes (Signed)
Cardiology Office Note  Date: 07/11/2015   ID: [name], DOB November 06, 1955, MRN [id number]  PCP: Donzetta Sprung, MD  Evaluating Cardiologist: Nona Dell, MD   Chief Complaint  Patient presents with  . PSVT    History of Present Illness: Alan Swanson is a 60 y.o. male former patient of Dr. Myrtis Ser, now establishing follow-up with me in clinic. This is our first meeting today. I reviewed the available records and updated the chart. He is here today with his wife.  States that he has had 2 recent visits to the [facility] ER in May secondary to sudden onset tachycardia. First episode occurred when he was working out in the heat in the yard, had also had green tea with caffeine shortly before the episode. Reports heart rate in the 160s at that time which apparently spontaneously resolved, did not require adenosine. Second episode occurred later in May with heart rate at least at 200 by his report, states that EMS was getting ready to give him adenosine, but the rhythm spontaneously converted. We are requested records from [place]. Only ECG available showed sinus rhythm with PACs.  He reports compliance with Toprol-XL. Was noted to be hypokalemic during evaluation, now on supplements. He has cut out caffeine. Reports only one episode similar to this in the last 4 years until May. He has not had syncope. No evidence of ACS by troponin T levels.   Past Medical History  Diagnosis Date  . PSVT (paroxysmal supraventricular tachycardia) (HCC)     Adenosine - June 2004  . Essential hypertension   . GERD (gastroesophageal reflux disease)   . Obesity   . Elevated CPK     Chronically elevated  . Anxiety disorder   . Palpitations   . Sleep apnea     CPAP - intermittent  . Depression   . History of kidney stones   . Arthritis     Past Surgical History  Procedure Laterality Date  . Left arm surgery      AS A CHILD    . Right shoulder surgery  1997    RECURRENT DISLOCATION AND  REPAIR OF ROTATOR CUFF IN 12/2004 BY DR MERRITT  . 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 chlorthalidone (HYGROTON) 25 MG tablet Take 12.5 mg by mouth daily.    . diclofenac sodium (VOLTAREN) 1 % GEL Apply topically daily.    Marland Kitchen etodolac (LODINE) 400 MG tablet Take 400 mg by mouth 2 (two) times daily.    . fluocinonide ointment (LIDEX) 0.05 % Apply 1 application topically as needed.    . fluticasone (FLONASE) 50 MCG/ACT nasal spray Place 1 spray into both nostrils daily.    Marland Kitchen loratadine (CLARITIN) 10 MG tablet Take 10 mg by mouth daily.     Marland Kitchen losartan (COZAAR) 100 MG tablet Take 100 mg by mouth daily.    . metoprolol (TOPROL-XL) 50 MG 24 hr tablet Take 50 mg by mouth daily.      . pantoprazole (PROTONIX) 40 MG tablet Take 40 mg by mouth daily.    . potassium chloride SA (K-DUR,KLOR-CON) 20 MEQ tablet Take 20 mEq by mouth daily.     No current facility-administered medications for this visit.   Allergies:  Ace inhibitors   Social History: The patient  reports that he has never smoked. He has never used smokeless tobacco. He reports that he does not drink alcohol or use illicit drugs.   Family History: The patient's family history includes Aneurysm in his mother; COPD in his father; Cancer in his other; Heart disease in his mother and other.   ROS:  Please see the history of present illness. Otherwise, complete review of systems is positive for arthritic pains.  All other systems are reviewed and negative.   Physical Exam: VS:  BP 113/72 mmHg  Pulse 60  Ht 5\' 10"  (1.778 m)  Wt 263 lb 3.2 oz (119.387 kg)  BMI 37.77 kg/m2  SpO2 99%, BMI Body mass index is 37.77 kg/(m^2).  Wt Readings from Last 3 Encounters:  07/11/15 263 lb 3.2 oz (119.387 kg)  05/01/13 250 lb (113.399 kg)    04/17/13 250 lb 4.8 oz (113.535 kg)    General: Obese male, appears comfortable at rest. HEENT: Conjunctiva and lids normal, oropharynx clear. Neck: Supple, no elevated JVP or carotid bruits, no thyromegaly. Lungs: Clear to auscultation, nonlabored breathing at rest. Cardiac: Regular rate and rhythm, no S3, soft systolic murmur, no pericardial rub. Abdomen: Soft, nontender, bowel sounds present, no guarding or rebound. Extremities: No pitting edema, distal pulses 2+. Skin: Warm and dry. Musculoskeletal: No kyphosis. Neuropsychiatric: Alert and oriented x3, affect grossly appropriate.  ECG: I personally reviewed the tracing from 03/24/2013 which showed normal sinus rhythm.  Recent Labwork:  May 2017: Hgb 14.9, platelets 168, creatinine 1.32, potassium 3.0, troponin T negative  Other Studies Reviewed Today:  Myoview 06/30/2002: THE PATIENT UNDERWENT A SINGLE DAY TREADMILL STRESS CARDIOLITE STUDY WITH 10 mCi 99mTc SESTAMIBI INJECTED IV PRIOR TO REST IMAGES AND 30 mCi PRIOR TO STRESS IMAGES. MYOCARDIAL PERFUSION WITH SPECT NO REVERSIBLE OR IRREVERSIBLE DEFECTS ARE SEEN TO SUGGEST ISCHEMIA OR INFARCTION. EJECTION FRACTION THE EJECTION FRACTION IS 62 PERCENT WITH END DIASTOLIC VOLUME OF 82 ML AND END SYSTOLIC VOLUME OF 31 ML. WALL MOTION ANALYSIS THERE IS NORMAL WALL MOTION AND WALL THICKENING IN ALL REGIONS. IMPRESSION 1. NO EVIDENCE OF ISCHEMIA OR INFARCTION. 2. EJECTION FRACTION IS 62 PERCENT.  Assessment and Plan:  1. History of PSVT, recent recurrences of rapid palpitations noted in May as outlined above. Do not have ECG of actual rhythm, but would suspect the same process given his history. Neither of the recent events required adenosine. Heart rate is in the 60s on current dose of Toprol-XL, we will continue observation for now. He is also on potassium supplement. Cutting out caffeine. Plan to see him back over the next 3 months, if episodes continue to recur on a more regular  basis, we will consider referral to EP for discussion of ablation.  2. Essential hypertension, blood pressure is well controlled today.  3. History of OSA with intermittent CPAP use.  4. Previous reassuring ischemic workup, reportedly had one more [facility] within the [facility] one to [facility].  Current medicines were reviewed with the patient today.  Disposition: Follow-up with me in 3 months.  Signed, Jonelle SidleSamuel G. Kalea Perine, MD, [facility]FACC 07/11/2015 9:11 AM    [facility] [address], [place], [place] [address] Phone: [phone]; Fax: [phone]) [phone]

## 2015-07-11 NOTE — Patient Instructions (Signed)
Medication Instructions:   Your physician recommends that you continue on your current medications as directed. Please refer to the Current Medication list given to you today.  Labwork:  NONE  Testing/Procedures:  NONE  Follow-Up:  Your physician recommends that you schedule a follow-up appointment in: 3 months.  Any Other Special Instructions Will Be Listed Below (If Applicable).  If you need a refill on your cardiac medications before your next appointment, please call your pharmacy. 

## 2015-07-14 NOTE — Progress Notes (Signed)
This serves as an addendum to the recent office note on June 19. Mr. Alan Swanson reports pending elective colonoscopy with Dr. Marcha Soldersathey next week. We discussed this at the office encounter, and I would anticipate that he should be able to proceed as planned on his current cardiac medical regimen. He has had some episodes of PSVT recently and should continue on Toprol-XL.  Jonelle SidleSamuel G. McDowell, M.D., F.A.C.C.

## 2015-07-21 DIAGNOSIS — Z1211 Encounter for screening for malignant neoplasm of colon: Secondary | ICD-10-CM | POA: Diagnosis not present

## 2015-07-21 DIAGNOSIS — I10 Essential (primary) hypertension: Secondary | ICD-10-CM | POA: Diagnosis not present

## 2015-07-21 DIAGNOSIS — K644 Residual hemorrhoidal skin tags: Secondary | ICD-10-CM | POA: Diagnosis not present

## 2015-07-21 DIAGNOSIS — K219 Gastro-esophageal reflux disease without esophagitis: Secondary | ICD-10-CM | POA: Diagnosis not present

## 2015-07-21 DIAGNOSIS — K635 Polyp of colon: Secondary | ICD-10-CM | POA: Diagnosis not present

## 2015-07-21 DIAGNOSIS — D120 Benign neoplasm of cecum: Secondary | ICD-10-CM | POA: Diagnosis not present

## 2015-07-21 DIAGNOSIS — Z888 Allergy status to other drugs, medicaments and biological substances status: Secondary | ICD-10-CM | POA: Diagnosis not present

## 2015-10-25 DIAGNOSIS — L919 Hypertrophic disorder of the skin, unspecified: Secondary | ICD-10-CM | POA: Diagnosis not present

## 2015-10-25 DIAGNOSIS — D485 Neoplasm of uncertain behavior of skin: Secondary | ICD-10-CM | POA: Diagnosis not present

## 2015-10-25 DIAGNOSIS — D1800 Hemangioma unspecified site: Secondary | ICD-10-CM | POA: Diagnosis not present

## 2015-10-25 DIAGNOSIS — L570 Actinic keratosis: Secondary | ICD-10-CM | POA: Diagnosis not present

## 2015-10-31 ENCOUNTER — Encounter: Payer: Self-pay | Admitting: *Deleted

## 2015-10-31 NOTE — Progress Notes (Signed)
Cardiology Office Note  Date: 11/01/2015   ID: Alan PresserRodger Lee Ky, DOB 05/01/1955, MRN [id number]  PCP: Donzetta SprungERRY DANIEL, MD  Primary Cardiologist: Nona DellSamuel Ismael Treptow, MD   Chief Complaint  Patient presents with  . PSVT    History of Present Illness: Alan Swanson is a 60 y.o. male last seen in June. He presents for follow-up of PSVT. He tells me that he had one episode of rapid palpitations since I saw him last time. He reports compliance with Toprol-XL at current dose. For now he is comfortable with continuing observation. We have discussed further advancing medical therapy, and even ultimately considering EP consultation if we cannot keep his symptoms adequately controlled. In the meanwhile he states that he is also going to try to diet and lose some weight.  Past Medical History:  Diagnosis Date  . Anxiety disorder   . Arthritis   . Depression   . Elevated CPK    Chronically elevated  . Essential hypertension   . GERD (gastroesophageal reflux disease)   . History of kidney stones   . Obesity   . Palpitations   . PSVT (paroxysmal supraventricular tachycardia) (HCC)    Adenosine - June 2004  . Sleep apnea    CPAP - intermittent    Current Outpatient Prescriptions  Medication Sig Dispense Refill  . chlorthalidone (HYGROTON) 25 MG tablet Take 12.5 mg by mouth daily.    . diclofenac sodium (VOLTAREN) 1 % GEL Apply topically daily.    Marland Kitchen. etodolac (LODINE) 400 MG tablet Take 400 mg by mouth 2 (two) times daily.    . fluocinonide ointment (LIDEX) 0.05 % Apply 1 application topically as needed.    . fluticasone (FLONASE) 50 MCG/ACT nasal spray Place 1 spray into both nostrils daily.    Marland Kitchen. loratadine (CLARITIN) 10 MG tablet Take 10 mg by mouth daily.     Marland Kitchen. losartan (COZAAR) 100 MG tablet Take 100 mg by mouth daily.    . metoprolol (TOPROL-XL) 50 MG 24 hr tablet Take 50 mg by mouth daily.      . pantoprazole (PROTONIX) 40 MG tablet Take 40 mg by mouth daily.    . potassium chloride  SA (K-DUR,KLOR-CON) 20 MEQ tablet Take 20 mEq by mouth daily.     No current facility-administered medications for this visit.    Allergies:  Ace inhibitors   Social History: The patient  reports that he has never smoked. He has never used smokeless tobacco. He reports that he does not drink alcohol or use drugs.   ROS:  Please see the history of present illness. Otherwise, complete review of systems is positive for left shoulder rotator cuff problems, limited range of motion.  All other systems are reviewed and negative.   Physical Exam: VS:  BP 122/62   Pulse 75   Ht 5\' 9"  (1.753 m)   Wt 273 lb (123.8 kg)   SpO2 95%   BMI 40.32 kg/m , BMI Body mass index is 40.32 kg/m.  Wt Readings from Last 3 Encounters:  11/01/15 273 lb (123.8 kg)  07/11/15 263 lb 3.2 oz (119.4 kg)  05/01/13 250 lb (113.4 kg)    General: Obese male, appears comfortable at rest. HEENT: Conjunctiva and lids normal, oropharynx clear. Neck: Supple, no elevated JVP or carotid bruits, no thyromegaly. Lungs: Clear to auscultation, nonlabored breathing at rest. Cardiac: Regular rate and rhythm, no S3, soft systolic murmur, no pericardial rub. Abdomen: Soft, nontender, bowel sounds present, no guarding or rebound. Extremities:  No pitting edema, distal pulses 2+.  ECG: I personally reviewed the tracing from 06/17/2015 which showed sinus rhythm with PACs.  Recent Labwork:  June 2017: Hemoglobin 14.5, platelets 184, BUN 17, creatinine 1.2, potassium 4.3, AST 41, ALT 32, cholesterol 152, triglycerides 137, HDL 33, LDL 92, hemoglobin A1c 6.1, TSH 2.26  Assessment and Plan:  1. PSVT, at this point plan to continue Toprol-XL 50 mg daily with observation. If symptoms escalate we can push the dose higher or possibly add Lanoxin.  2. Essential hypertension, blood pressure is adequately controlled today. He continues on chlorthalidone and Cozaar in addition to his Toprol-XL.  Current medicines were reviewed with the  patient today.  Disposition: Follow-up with me in 6 months.  Signed, Jonelle Sidle, MD, [facility] 11/01/2015 10:13 AM    [facility] at [facility] [address] [place], [place], [place] [address] Phone: [phone]; Fax: [phone]

## 2015-11-01 ENCOUNTER — Encounter: Payer: Self-pay | Admitting: Cardiology

## 2015-11-01 ENCOUNTER — Ambulatory Visit (INDEPENDENT_AMBULATORY_CARE_PROVIDER_SITE_OTHER): Payer: Federal, State, Local not specified - PPO | Admitting: Cardiology

## 2015-11-01 VITALS — BP 122/62 | HR 75 | Ht 69.0 in | Wt 273.0 lb

## 2015-11-01 DIAGNOSIS — I10 Essential (primary) hypertension: Secondary | ICD-10-CM

## 2015-11-01 DIAGNOSIS — I471 Supraventricular tachycardia: Secondary | ICD-10-CM | POA: Diagnosis not present

## 2015-11-01 NOTE — Patient Instructions (Signed)

## 2015-11-09 DIAGNOSIS — K080 Exfoliation of teeth due to systemic causes: Secondary | ICD-10-CM | POA: Diagnosis not present

## 2015-11-24 DIAGNOSIS — L570 Actinic keratosis: Secondary | ICD-10-CM | POA: Diagnosis not present

## 2015-12-06 DIAGNOSIS — M25512 Pain in left shoulder: Secondary | ICD-10-CM | POA: Diagnosis not present

## 2015-12-12 DIAGNOSIS — E782 Mixed hyperlipidemia: Secondary | ICD-10-CM | POA: Diagnosis not present

## 2015-12-12 DIAGNOSIS — I10 Essential (primary) hypertension: Secondary | ICD-10-CM | POA: Diagnosis not present

## 2015-12-12 DIAGNOSIS — N400 Enlarged prostate without lower urinary tract symptoms: Secondary | ICD-10-CM | POA: Diagnosis not present

## 2015-12-12 DIAGNOSIS — R7301 Impaired fasting glucose: Secondary | ICD-10-CM | POA: Diagnosis not present

## 2015-12-12 DIAGNOSIS — E8881 Metabolic syndrome: Secondary | ICD-10-CM | POA: Diagnosis not present

## 2015-12-14 DIAGNOSIS — I10 Essential (primary) hypertension: Secondary | ICD-10-CM | POA: Diagnosis not present

## 2015-12-14 DIAGNOSIS — E8881 Metabolic syndrome: Secondary | ICD-10-CM | POA: Diagnosis not present

## 2015-12-14 DIAGNOSIS — J301 Allergic rhinitis due to pollen: Secondary | ICD-10-CM | POA: Diagnosis not present

## 2015-12-14 DIAGNOSIS — E782 Mixed hyperlipidemia: Secondary | ICD-10-CM | POA: Diagnosis not present

## 2015-12-26 DIAGNOSIS — S43402A Unspecified sprain of left shoulder joint, initial encounter: Secondary | ICD-10-CM | POA: Diagnosis not present

## 2015-12-26 DIAGNOSIS — M25512 Pain in left shoulder: Secondary | ICD-10-CM | POA: Diagnosis not present

## 2016-01-13 DIAGNOSIS — J0101 Acute recurrent maxillary sinusitis: Secondary | ICD-10-CM | POA: Diagnosis not present

## 2016-01-13 DIAGNOSIS — Z6839 Body mass index (BMI) 39.0-39.9, adult: Secondary | ICD-10-CM | POA: Diagnosis not present

## 2016-02-01 DIAGNOSIS — Z6838 Body mass index (BMI) 38.0-38.9, adult: Secondary | ICD-10-CM | POA: Diagnosis not present

## 2016-02-01 DIAGNOSIS — R05 Cough: Secondary | ICD-10-CM | POA: Diagnosis not present

## 2016-03-14 DIAGNOSIS — M66822 Spontaneous rupture of other tendons, left upper arm: Secondary | ICD-10-CM | POA: Diagnosis not present

## 2016-03-14 DIAGNOSIS — M75122 Complete rotator cuff tear or rupture of left shoulder, not specified as traumatic: Secondary | ICD-10-CM | POA: Diagnosis not present

## 2016-03-14 DIAGNOSIS — M7552 Bursitis of left shoulder: Secondary | ICD-10-CM | POA: Diagnosis not present

## 2016-03-14 DIAGNOSIS — G8918 Other acute postprocedural pain: Secondary | ICD-10-CM | POA: Diagnosis not present

## 2016-03-14 DIAGNOSIS — M19012 Primary osteoarthritis, left shoulder: Secondary | ICD-10-CM | POA: Diagnosis not present

## 2016-03-14 DIAGNOSIS — S46012A Strain of muscle(s) and tendon(s) of the rotator cuff of left shoulder, initial encounter: Secondary | ICD-10-CM | POA: Diagnosis not present

## 2016-03-14 DIAGNOSIS — M7522 Bicipital tendinitis, left shoulder: Secondary | ICD-10-CM | POA: Diagnosis not present

## 2016-03-14 DIAGNOSIS — M24112 Other articular cartilage disorders, left shoulder: Secondary | ICD-10-CM | POA: Diagnosis not present

## 2016-03-14 DIAGNOSIS — M7542 Impingement syndrome of left shoulder: Secondary | ICD-10-CM | POA: Diagnosis not present

## 2016-03-20 DIAGNOSIS — M19012 Primary osteoarthritis, left shoulder: Secondary | ICD-10-CM | POA: Diagnosis not present

## 2016-04-09 DIAGNOSIS — K219 Gastro-esophageal reflux disease without esophagitis: Secondary | ICD-10-CM | POA: Diagnosis not present

## 2016-04-09 DIAGNOSIS — E8881 Metabolic syndrome: Secondary | ICD-10-CM | POA: Diagnosis not present

## 2016-04-09 DIAGNOSIS — E782 Mixed hyperlipidemia: Secondary | ICD-10-CM | POA: Diagnosis not present

## 2016-04-09 DIAGNOSIS — I10 Essential (primary) hypertension: Secondary | ICD-10-CM | POA: Diagnosis not present

## 2016-04-11 DIAGNOSIS — K219 Gastro-esophageal reflux disease without esophagitis: Secondary | ICD-10-CM | POA: Diagnosis not present

## 2016-04-11 DIAGNOSIS — E8881 Metabolic syndrome: Secondary | ICD-10-CM | POA: Diagnosis not present

## 2016-04-11 DIAGNOSIS — Z23 Encounter for immunization: Secondary | ICD-10-CM | POA: Diagnosis not present

## 2016-04-11 DIAGNOSIS — E782 Mixed hyperlipidemia: Secondary | ICD-10-CM | POA: Diagnosis not present

## 2016-04-11 DIAGNOSIS — I10 Essential (primary) hypertension: Secondary | ICD-10-CM | POA: Diagnosis not present

## 2016-04-17 DIAGNOSIS — M19012 Primary osteoarthritis, left shoulder: Secondary | ICD-10-CM | POA: Diagnosis not present

## 2016-04-24 DIAGNOSIS — M25412 Effusion, left shoulder: Secondary | ICD-10-CM | POA: Diagnosis not present

## 2016-04-24 DIAGNOSIS — M25612 Stiffness of left shoulder, not elsewhere classified: Secondary | ICD-10-CM | POA: Diagnosis not present

## 2016-04-24 DIAGNOSIS — M25512 Pain in left shoulder: Secondary | ICD-10-CM | POA: Diagnosis not present

## 2016-04-26 DIAGNOSIS — M25512 Pain in left shoulder: Secondary | ICD-10-CM | POA: Diagnosis not present

## 2016-04-26 DIAGNOSIS — M25612 Stiffness of left shoulder, not elsewhere classified: Secondary | ICD-10-CM | POA: Diagnosis not present

## 2016-04-26 DIAGNOSIS — M25412 Effusion, left shoulder: Secondary | ICD-10-CM | POA: Diagnosis not present

## 2016-05-01 DIAGNOSIS — M25612 Stiffness of left shoulder, not elsewhere classified: Secondary | ICD-10-CM | POA: Diagnosis not present

## 2016-05-01 DIAGNOSIS — M25512 Pain in left shoulder: Secondary | ICD-10-CM | POA: Diagnosis not present

## 2016-05-01 DIAGNOSIS — M25412 Effusion, left shoulder: Secondary | ICD-10-CM | POA: Diagnosis not present

## 2016-05-03 DIAGNOSIS — M25612 Stiffness of left shoulder, not elsewhere classified: Secondary | ICD-10-CM | POA: Diagnosis not present

## 2016-05-03 DIAGNOSIS — M25512 Pain in left shoulder: Secondary | ICD-10-CM | POA: Diagnosis not present

## 2016-05-03 DIAGNOSIS — M25412 Effusion, left shoulder: Secondary | ICD-10-CM | POA: Diagnosis not present

## 2016-05-08 DIAGNOSIS — M25512 Pain in left shoulder: Secondary | ICD-10-CM | POA: Diagnosis not present

## 2016-05-08 DIAGNOSIS — M25612 Stiffness of left shoulder, not elsewhere classified: Secondary | ICD-10-CM | POA: Diagnosis not present

## 2016-05-08 DIAGNOSIS — M25412 Effusion, left shoulder: Secondary | ICD-10-CM | POA: Diagnosis not present

## 2016-05-10 DIAGNOSIS — M25512 Pain in left shoulder: Secondary | ICD-10-CM | POA: Diagnosis not present

## 2016-05-10 DIAGNOSIS — M25612 Stiffness of left shoulder, not elsewhere classified: Secondary | ICD-10-CM | POA: Diagnosis not present

## 2016-05-10 DIAGNOSIS — M25412 Effusion, left shoulder: Secondary | ICD-10-CM | POA: Diagnosis not present

## 2016-05-16 DIAGNOSIS — M25412 Effusion, left shoulder: Secondary | ICD-10-CM | POA: Diagnosis not present

## 2016-05-16 DIAGNOSIS — M25612 Stiffness of left shoulder, not elsewhere classified: Secondary | ICD-10-CM | POA: Diagnosis not present

## 2016-05-16 DIAGNOSIS — M25512 Pain in left shoulder: Secondary | ICD-10-CM | POA: Diagnosis not present

## 2016-05-17 DIAGNOSIS — M25612 Stiffness of left shoulder, not elsewhere classified: Secondary | ICD-10-CM | POA: Diagnosis not present

## 2016-05-17 DIAGNOSIS — M25412 Effusion, left shoulder: Secondary | ICD-10-CM | POA: Diagnosis not present

## 2016-05-17 DIAGNOSIS — M25512 Pain in left shoulder: Secondary | ICD-10-CM | POA: Diagnosis not present

## 2016-05-21 DIAGNOSIS — J019 Acute sinusitis, unspecified: Secondary | ICD-10-CM | POA: Diagnosis not present

## 2016-05-21 DIAGNOSIS — M25512 Pain in left shoulder: Secondary | ICD-10-CM | POA: Diagnosis not present

## 2016-05-21 DIAGNOSIS — Z6839 Body mass index (BMI) 39.0-39.9, adult: Secondary | ICD-10-CM | POA: Diagnosis not present

## 2016-05-21 DIAGNOSIS — M25412 Effusion, left shoulder: Secondary | ICD-10-CM | POA: Diagnosis not present

## 2016-05-21 DIAGNOSIS — J209 Acute bronchitis, unspecified: Secondary | ICD-10-CM | POA: Diagnosis not present

## 2016-05-21 DIAGNOSIS — M25612 Stiffness of left shoulder, not elsewhere classified: Secondary | ICD-10-CM | POA: Diagnosis not present

## 2016-05-22 DIAGNOSIS — M19012 Primary osteoarthritis, left shoulder: Secondary | ICD-10-CM | POA: Diagnosis not present

## 2016-05-24 DIAGNOSIS — M25512 Pain in left shoulder: Secondary | ICD-10-CM | POA: Diagnosis not present

## 2016-05-24 DIAGNOSIS — M25412 Effusion, left shoulder: Secondary | ICD-10-CM | POA: Diagnosis not present

## 2016-05-24 DIAGNOSIS — M25612 Stiffness of left shoulder, not elsewhere classified: Secondary | ICD-10-CM | POA: Diagnosis not present

## 2016-05-28 DIAGNOSIS — M25612 Stiffness of left shoulder, not elsewhere classified: Secondary | ICD-10-CM | POA: Diagnosis not present

## 2016-05-28 DIAGNOSIS — M25412 Effusion, left shoulder: Secondary | ICD-10-CM | POA: Diagnosis not present

## 2016-05-28 DIAGNOSIS — M25512 Pain in left shoulder: Secondary | ICD-10-CM | POA: Diagnosis not present

## 2016-05-29 DIAGNOSIS — K080 Exfoliation of teeth due to systemic causes: Secondary | ICD-10-CM | POA: Diagnosis not present

## 2016-05-31 DIAGNOSIS — M25612 Stiffness of left shoulder, not elsewhere classified: Secondary | ICD-10-CM | POA: Diagnosis not present

## 2016-05-31 DIAGNOSIS — M25412 Effusion, left shoulder: Secondary | ICD-10-CM | POA: Diagnosis not present

## 2016-05-31 DIAGNOSIS — M25512 Pain in left shoulder: Secondary | ICD-10-CM | POA: Diagnosis not present

## 2016-06-04 DIAGNOSIS — M25412 Effusion, left shoulder: Secondary | ICD-10-CM | POA: Diagnosis not present

## 2016-06-04 DIAGNOSIS — M25512 Pain in left shoulder: Secondary | ICD-10-CM | POA: Diagnosis not present

## 2016-06-04 DIAGNOSIS — M25612 Stiffness of left shoulder, not elsewhere classified: Secondary | ICD-10-CM | POA: Diagnosis not present

## 2016-06-06 DIAGNOSIS — M25412 Effusion, left shoulder: Secondary | ICD-10-CM | POA: Diagnosis not present

## 2016-06-06 DIAGNOSIS — M25512 Pain in left shoulder: Secondary | ICD-10-CM | POA: Diagnosis not present

## 2016-06-06 DIAGNOSIS — M25612 Stiffness of left shoulder, not elsewhere classified: Secondary | ICD-10-CM | POA: Diagnosis not present

## 2016-06-12 DIAGNOSIS — M25612 Stiffness of left shoulder, not elsewhere classified: Secondary | ICD-10-CM | POA: Diagnosis not present

## 2016-06-12 DIAGNOSIS — Z23 Encounter for immunization: Secondary | ICD-10-CM | POA: Diagnosis not present

## 2016-06-12 DIAGNOSIS — M25512 Pain in left shoulder: Secondary | ICD-10-CM | POA: Diagnosis not present

## 2016-06-12 DIAGNOSIS — M25412 Effusion, left shoulder: Secondary | ICD-10-CM | POA: Diagnosis not present

## 2016-06-13 DIAGNOSIS — J189 Pneumonia, unspecified organism: Secondary | ICD-10-CM | POA: Diagnosis not present

## 2016-06-13 DIAGNOSIS — Z6841 Body Mass Index (BMI) 40.0 and over, adult: Secondary | ICD-10-CM | POA: Diagnosis not present

## 2016-06-14 DIAGNOSIS — M25612 Stiffness of left shoulder, not elsewhere classified: Secondary | ICD-10-CM | POA: Diagnosis not present

## 2016-06-14 DIAGNOSIS — M25412 Effusion, left shoulder: Secondary | ICD-10-CM | POA: Diagnosis not present

## 2016-06-14 DIAGNOSIS — M25512 Pain in left shoulder: Secondary | ICD-10-CM | POA: Diagnosis not present

## 2016-07-05 DIAGNOSIS — M19012 Primary osteoarthritis, left shoulder: Secondary | ICD-10-CM | POA: Diagnosis not present

## 2016-08-10 DIAGNOSIS — Z0001 Encounter for general adult medical examination with abnormal findings: Secondary | ICD-10-CM | POA: Diagnosis not present

## 2016-08-20 ENCOUNTER — Telehealth: Payer: Self-pay | Admitting: *Deleted

## 2016-08-20 DIAGNOSIS — Z0001 Encounter for general adult medical examination with abnormal findings: Secondary | ICD-10-CM | POA: Diagnosis not present

## 2016-08-20 DIAGNOSIS — M545 Low back pain: Secondary | ICD-10-CM | POA: Diagnosis not present

## 2016-08-20 DIAGNOSIS — E782 Mixed hyperlipidemia: Secondary | ICD-10-CM | POA: Diagnosis not present

## 2016-08-20 DIAGNOSIS — I10 Essential (primary) hypertension: Secondary | ICD-10-CM | POA: Diagnosis not present

## 2016-08-20 DIAGNOSIS — E8881 Metabolic syndrome: Secondary | ICD-10-CM | POA: Diagnosis not present

## 2016-08-20 NOTE — Telephone Encounter (Signed)
Pt received recall phone message and was due to come back in April 6 month f/u. Pt requested to be seen in October - says he has been doing great - scheduled appt for October pt will call if needed prior to this appt - [facility]

## 2016-08-23 DIAGNOSIS — J019 Acute sinusitis, unspecified: Secondary | ICD-10-CM | POA: Diagnosis not present

## 2016-08-23 DIAGNOSIS — G4730 Sleep apnea, unspecified: Secondary | ICD-10-CM | POA: Diagnosis not present

## 2016-08-23 DIAGNOSIS — Z6838 Body mass index (BMI) 38.0-38.9, adult: Secondary | ICD-10-CM | POA: Diagnosis not present

## 2016-09-13 DIAGNOSIS — R05 Cough: Secondary | ICD-10-CM | POA: Diagnosis not present

## 2016-09-13 DIAGNOSIS — Z6839 Body mass index (BMI) 39.0-39.9, adult: Secondary | ICD-10-CM | POA: Diagnosis not present

## 2016-11-12 ENCOUNTER — Ambulatory Visit (INDEPENDENT_AMBULATORY_CARE_PROVIDER_SITE_OTHER): Payer: Federal, State, Local not specified - PPO | Admitting: Cardiology

## 2016-11-12 ENCOUNTER — Encounter: Payer: Self-pay | Admitting: Cardiology

## 2016-11-12 VITALS — BP 100/59 | HR 56 | Ht 69.0 in | Wt 278.4 lb

## 2016-11-12 DIAGNOSIS — I10 Essential (primary) hypertension: Secondary | ICD-10-CM | POA: Diagnosis not present

## 2016-11-12 DIAGNOSIS — I471 Supraventricular tachycardia: Secondary | ICD-10-CM

## 2016-11-12 NOTE — Patient Instructions (Signed)

## 2016-11-12 NOTE — Progress Notes (Signed)
Cardiology Office Note  Date: 11/12/2016   ID: Alan Swanson, DOB 02/07/1955, MRN [id number]  PCP: Alan Swanson, Alan Swanson  Primary Cardiologist: Alan DellSamuel Hayze Gazda, Swanson   Chief Complaint  Patient presents with  . PSVT    History of Present Illness: Alan Swanson is a 61 y.o. male last seen in October 2017. He presents for a routine follow-up visit. He states that he had one episode of PSVT over the last year, actually occurred recently when he was doing some artwork. He states his heart rate got up in the 160s, symptoms persisted for almost an hour. He did not require specific intervention however.  I reviewed his medications. He continues on Toprol-XL at 50 mg daily, reports compliance.  I personally reviewed his ECG today which shows sinus rhythm with IVCD and PVCs.  Past Medical History:  Diagnosis Date  . Anxiety disorder   . Arthritis   . Depression   . Elevated CPK    Chronically elevated  . Essential hypertension   . GERD (gastroesophageal reflux disease)   . History of kidney stones   . Obesity   . Palpitations   . PSVT (paroxysmal supraventricular tachycardia) (HCC)    Adenosine - June 2004  . Sleep apnea    CPAP - intermittent    Past Surgical History:  Procedure Laterality Date  . LEFT ARM SURGERY     AS A CHILD    . RIGHT SHOULDER SURGERY  1997   RECURRENT DISLOCATION AND REPAIR OF ROTATOR CUFF IN 12/2004 BY DR MERRITT  . TOTAL KNEE ARTHROPLASTY Right 05/01/2013   Procedure: RIGHT TOTAL KNEE ARTHROPLASTY;  Surgeon: Thera FlakeW D Caffrey Jr., Swanson;  Location: [facility] OR;  Service: Orthopedics;  Laterality: Right;    Current Outpatient Prescriptions  Medication Sig Dispense Refill  . chlorthalidone (HYGROTON) 25 MG tablet Take 12.5 mg by mouth daily.    Marland Kitchen. etodolac (LODINE) 400 MG tablet Take 400 mg by mouth 2 (two) times daily.    Marland Kitchen. loratadine (CLARITIN) 10 MG tablet Take 10 mg by mouth daily.     Marland Kitchen. losartan (COZAAR) 100 MG tablet Take 100 mg by mouth daily.    .  metoprolol (TOPROL-XL) 50 MG 24 hr tablet Take 50 mg by mouth daily.      . pantoprazole (PROTONIX) 40 MG tablet Take 40 mg by mouth daily.    . potassium chloride SA (K-DUR,KLOR-CON) 20 MEQ tablet Take 20 mEq by mouth daily.    . ranitidine (ZANTAC) 150 MG tablet Take 1 tablet by mouth daily.  1   No current facility-administered medications for this visit.    Allergies:  Ace inhibitors   Social History: The patient  reports that he has never smoked. He has never used smokeless tobacco. He reports that he does not drink alcohol or use drugs.   ROS:  Please see the history of present illness. Otherwise, complete review of systems is positive for chronic arthritic pains.  All other systems are reviewed and negative.   Physical Exam: VS:  BP (!) 100/59   Pulse (!) 56   Ht 5\' 9"  (1.753 m)   Wt 278 lb 6.4 oz (126.3 kg)   SpO2 97% Comment: on room air  BMI 41.11 kg/m , BMI Body mass index is 41.11 kg/m.  Wt Readings from Last 3 Encounters:  11/12/16 278 lb 6.4 oz (126.3 kg)  11/01/15 273 lb (123.8 kg)  07/11/15 263 lb 3.2 oz (119.4 kg)    General:  Obese male, appears comfortable at rest. HEENT: Conjunctiva and lids normal, oropharynx clear. Neck: Supple, no elevated JVP or carotid bruits, no thyromegaly. Lungs: Clear to auscultation, nonlabored breathing at rest. Cardiac: Regular rate and rhythm, no S3 or significant systolic murmur, no pericardial rub. Abdomen: Soft, nontender, bowel sounds present, no guarding or rebound. Extremities: No pitting edema, distal pulses 2+. Skin: Warm and dry. Musculoskeletal: No kyphosis. Neuropsychiatric: Alert and oriented x3, affect grossly appropriate.  ECG: I personally reviewed the tracing from 06/17/2015 which showed sinus rhythm with PACs.  Recent Labwork:  June 2017: Hemoglobin 14.5, platelets 184, BUN 17, creatinine 1.24, potassium 4.3, AST 41, ALT 32, cholesterol 152, triglycerides 137, HDL 33, LDL 92, hemoglobin A1c 6.1, TSH 2.26    Other Studies Reviewed Today:  Chest x-ray 04/17/2013: FINDINGS: Lungs are clear. Cardiomediastinal silhouette and remainder of the exam is unchanged.  IMPRESSION: No active cardiopulmonary disease.  Assessment and Plan:  1. PSVT, one breakthrough episode in the last year on Toprol-XL 50 mg daily. He remains comfortable with observation for now.  2. Essential hypertension, blood pressure well controlled. He is also on chlorthalidone with potassium supplements and Cozaar. Keep follow-up with Dr. Reuel Boom.  Current medicines were reviewed with the patient today.   Orders Placed This Encounter  Procedures  . EKG 12-Lead    Disposition: Follow-up in one year, sooner if needed.  Signed, Alan Sidle, Swanson, [facility] 11/12/2016 9:36 AM    [facility] at [facility] [address] [place], [place], [place] [address] Phone: [phone]; Fax: [phone]

## 2016-11-26 DIAGNOSIS — L570 Actinic keratosis: Secondary | ICD-10-CM | POA: Diagnosis not present

## 2016-11-26 DIAGNOSIS — L814 Other melanin hyperpigmentation: Secondary | ICD-10-CM | POA: Diagnosis not present

## 2016-11-26 DIAGNOSIS — D1800 Hemangioma unspecified site: Secondary | ICD-10-CM | POA: Diagnosis not present

## 2016-11-26 DIAGNOSIS — D485 Neoplasm of uncertain behavior of skin: Secondary | ICD-10-CM | POA: Diagnosis not present

## 2016-12-11 DIAGNOSIS — K080 Exfoliation of teeth due to systemic causes: Secondary | ICD-10-CM | POA: Diagnosis not present

## 2016-12-18 DIAGNOSIS — Z6841 Body Mass Index (BMI) 40.0 and over, adult: Secondary | ICD-10-CM | POA: Diagnosis not present

## 2016-12-18 DIAGNOSIS — R05 Cough: Secondary | ICD-10-CM | POA: Diagnosis not present

## 2016-12-20 DIAGNOSIS — I10 Essential (primary) hypertension: Secondary | ICD-10-CM | POA: Diagnosis not present

## 2016-12-20 DIAGNOSIS — K219 Gastro-esophageal reflux disease without esophagitis: Secondary | ICD-10-CM | POA: Diagnosis not present

## 2016-12-20 DIAGNOSIS — E8881 Metabolic syndrome: Secondary | ICD-10-CM | POA: Diagnosis not present

## 2016-12-20 DIAGNOSIS — E782 Mixed hyperlipidemia: Secondary | ICD-10-CM | POA: Diagnosis not present

## 2016-12-24 DIAGNOSIS — K219 Gastro-esophageal reflux disease without esophagitis: Secondary | ICD-10-CM | POA: Diagnosis not present

## 2016-12-24 DIAGNOSIS — E8881 Metabolic syndrome: Secondary | ICD-10-CM | POA: Diagnosis not present

## 2016-12-24 DIAGNOSIS — I10 Essential (primary) hypertension: Secondary | ICD-10-CM | POA: Diagnosis not present

## 2016-12-24 DIAGNOSIS — E782 Mixed hyperlipidemia: Secondary | ICD-10-CM | POA: Diagnosis not present

## 2017-01-18 DIAGNOSIS — I10 Essential (primary) hypertension: Secondary | ICD-10-CM | POA: Diagnosis not present

## 2017-01-18 DIAGNOSIS — R7301 Impaired fasting glucose: Secondary | ICD-10-CM | POA: Diagnosis not present

## 2017-01-18 DIAGNOSIS — E8881 Metabolic syndrome: Secondary | ICD-10-CM | POA: Diagnosis not present

## 2017-02-04 DIAGNOSIS — K080 Exfoliation of teeth due to systemic causes: Secondary | ICD-10-CM | POA: Diagnosis not present

## 2017-02-07 DIAGNOSIS — I10 Essential (primary) hypertension: Secondary | ICD-10-CM | POA: Diagnosis not present

## 2017-02-07 DIAGNOSIS — E8881 Metabolic syndrome: Secondary | ICD-10-CM | POA: Diagnosis not present

## 2017-02-13 DIAGNOSIS — R0683 Snoring: Secondary | ICD-10-CM | POA: Diagnosis not present

## 2017-02-13 DIAGNOSIS — R0681 Apnea, not elsewhere classified: Secondary | ICD-10-CM | POA: Diagnosis not present

## 2017-02-13 DIAGNOSIS — G478 Other sleep disorders: Secondary | ICD-10-CM | POA: Diagnosis not present

## 2017-02-13 DIAGNOSIS — G4719 Other hypersomnia: Secondary | ICD-10-CM | POA: Diagnosis not present

## 2017-03-27 DIAGNOSIS — G4733 Obstructive sleep apnea (adult) (pediatric): Secondary | ICD-10-CM | POA: Diagnosis not present

## 2017-04-08 DIAGNOSIS — G4733 Obstructive sleep apnea (adult) (pediatric): Secondary | ICD-10-CM | POA: Diagnosis not present

## 2017-04-25 DIAGNOSIS — R05 Cough: Secondary | ICD-10-CM | POA: Diagnosis not present

## 2017-04-25 DIAGNOSIS — J309 Allergic rhinitis, unspecified: Secondary | ICD-10-CM | POA: Diagnosis not present

## 2017-04-25 DIAGNOSIS — Z6841 Body Mass Index (BMI) 40.0 and over, adult: Secondary | ICD-10-CM | POA: Diagnosis not present

## 2017-05-01 DIAGNOSIS — R05 Cough: Secondary | ICD-10-CM | POA: Diagnosis not present

## 2017-05-01 DIAGNOSIS — J209 Acute bronchitis, unspecified: Secondary | ICD-10-CM | POA: Diagnosis not present

## 2017-05-01 DIAGNOSIS — J309 Allergic rhinitis, unspecified: Secondary | ICD-10-CM | POA: Diagnosis not present

## 2017-05-01 DIAGNOSIS — J0101 Acute recurrent maxillary sinusitis: Secondary | ICD-10-CM | POA: Diagnosis not present

## 2017-05-09 DIAGNOSIS — G4733 Obstructive sleep apnea (adult) (pediatric): Secondary | ICD-10-CM | POA: Diagnosis not present

## 2017-05-15 DIAGNOSIS — G4730 Sleep apnea, unspecified: Secondary | ICD-10-CM | POA: Diagnosis not present

## 2017-05-15 DIAGNOSIS — E8881 Metabolic syndrome: Secondary | ICD-10-CM | POA: Diagnosis not present

## 2017-05-15 DIAGNOSIS — I10 Essential (primary) hypertension: Secondary | ICD-10-CM | POA: Diagnosis not present

## 2017-05-15 DIAGNOSIS — Z9189 Other specified personal risk factors, not elsewhere classified: Secondary | ICD-10-CM | POA: Diagnosis not present

## 2017-05-15 DIAGNOSIS — E782 Mixed hyperlipidemia: Secondary | ICD-10-CM | POA: Diagnosis not present

## 2017-05-17 DIAGNOSIS — I10 Essential (primary) hypertension: Secondary | ICD-10-CM | POA: Diagnosis not present

## 2017-05-17 DIAGNOSIS — E8881 Metabolic syndrome: Secondary | ICD-10-CM | POA: Diagnosis not present

## 2017-05-17 DIAGNOSIS — K219 Gastro-esophageal reflux disease without esophagitis: Secondary | ICD-10-CM | POA: Diagnosis not present

## 2017-05-17 DIAGNOSIS — E782 Mixed hyperlipidemia: Secondary | ICD-10-CM | POA: Diagnosis not present

## 2017-06-08 DIAGNOSIS — G4733 Obstructive sleep apnea (adult) (pediatric): Secondary | ICD-10-CM | POA: Diagnosis not present

## 2017-06-19 DIAGNOSIS — K080 Exfoliation of teeth due to systemic causes: Secondary | ICD-10-CM | POA: Diagnosis not present

## 2017-06-25 DIAGNOSIS — B356 Tinea cruris: Secondary | ICD-10-CM | POA: Diagnosis not present

## 2017-06-25 DIAGNOSIS — Z6838 Body mass index (BMI) 38.0-38.9, adult: Secondary | ICD-10-CM | POA: Diagnosis not present

## 2017-06-27 DIAGNOSIS — M19012 Primary osteoarthritis, left shoulder: Secondary | ICD-10-CM | POA: Diagnosis not present

## 2017-07-01 DIAGNOSIS — G4733 Obstructive sleep apnea (adult) (pediatric): Secondary | ICD-10-CM | POA: Diagnosis not present

## 2017-07-08 DIAGNOSIS — G4733 Obstructive sleep apnea (adult) (pediatric): Secondary | ICD-10-CM | POA: Diagnosis not present

## 2017-07-09 DIAGNOSIS — G4733 Obstructive sleep apnea (adult) (pediatric): Secondary | ICD-10-CM | POA: Diagnosis not present

## 2017-08-08 DIAGNOSIS — G4733 Obstructive sleep apnea (adult) (pediatric): Secondary | ICD-10-CM | POA: Diagnosis not present

## 2017-09-08 DIAGNOSIS — G4733 Obstructive sleep apnea (adult) (pediatric): Secondary | ICD-10-CM | POA: Diagnosis not present

## 2017-09-13 DIAGNOSIS — K219 Gastro-esophageal reflux disease without esophagitis: Secondary | ICD-10-CM | POA: Diagnosis not present

## 2017-09-13 DIAGNOSIS — I10 Essential (primary) hypertension: Secondary | ICD-10-CM | POA: Diagnosis not present

## 2017-09-13 DIAGNOSIS — E782 Mixed hyperlipidemia: Secondary | ICD-10-CM | POA: Diagnosis not present

## 2017-09-13 DIAGNOSIS — R42 Dizziness and giddiness: Secondary | ICD-10-CM | POA: Diagnosis not present

## 2017-09-16 DIAGNOSIS — Z1212 Encounter for screening for malignant neoplasm of rectum: Secondary | ICD-10-CM | POA: Diagnosis not present

## 2017-09-16 DIAGNOSIS — M545 Low back pain: Secondary | ICD-10-CM | POA: Diagnosis not present

## 2017-09-16 DIAGNOSIS — E782 Mixed hyperlipidemia: Secondary | ICD-10-CM | POA: Diagnosis not present

## 2017-09-16 DIAGNOSIS — E8881 Metabolic syndrome: Secondary | ICD-10-CM | POA: Diagnosis not present

## 2017-09-16 DIAGNOSIS — Z0001 Encounter for general adult medical examination with abnormal findings: Secondary | ICD-10-CM | POA: Diagnosis not present

## 2017-09-16 DIAGNOSIS — I10 Essential (primary) hypertension: Secondary | ICD-10-CM | POA: Diagnosis not present

## 2017-10-09 ENCOUNTER — Ambulatory Visit (INDEPENDENT_AMBULATORY_CARE_PROVIDER_SITE_OTHER): Payer: Self-pay | Admitting: Internal Medicine

## 2017-10-09 DIAGNOSIS — G4733 Obstructive sleep apnea (adult) (pediatric): Secondary | ICD-10-CM | POA: Diagnosis not present

## 2017-10-18 DIAGNOSIS — G4733 Obstructive sleep apnea (adult) (pediatric): Secondary | ICD-10-CM | POA: Diagnosis not present

## 2017-10-21 ENCOUNTER — Encounter (INDEPENDENT_AMBULATORY_CARE_PROVIDER_SITE_OTHER): Payer: Self-pay | Admitting: Internal Medicine

## 2017-10-21 ENCOUNTER — Ambulatory Visit (INDEPENDENT_AMBULATORY_CARE_PROVIDER_SITE_OTHER): Payer: Federal, State, Local not specified - PPO | Admitting: Internal Medicine

## 2017-10-21 ENCOUNTER — Encounter (INDEPENDENT_AMBULATORY_CARE_PROVIDER_SITE_OTHER): Payer: Self-pay | Admitting: *Deleted

## 2017-10-21 VITALS — BP 132/62 | HR 56 | Temp 97.7°F | Ht 69.5 in | Wt 271.3 lb

## 2017-10-21 DIAGNOSIS — R1319 Other dysphagia: Secondary | ICD-10-CM

## 2017-10-21 DIAGNOSIS — R1310 Dysphagia, unspecified: Secondary | ICD-10-CM | POA: Diagnosis not present

## 2017-10-21 NOTE — Progress Notes (Signed)
   Subjective:    Patient ID: Alan Swanson, male    DOB: [birth year], 62 y.o.   MRN: [id number]  HPI Referred by Dr. Reuel Boom for dysphagia. States every once in a while, pills are slow to go down. Sometimes has trouble drinking water.  Has had symptoms for a couple of years. Really has no trouble with foods. Has trouble with water and pills. His appetite is good. No weight loss. Has a BM daily. No melena or BRRB.   Hx of HTN and PSVT  Review of Systems Past Medical History:  Diagnosis Date  . Anxiety disorder   . Arthritis   . Depression   . Elevated CPK    Chronically elevated  . Essential hypertension   . GERD (gastroesophageal reflux disease)   . History of kidney stones   . Obesity   . Palpitations   . PSVT (paroxysmal supraventricular tachycardia) (HCC)    Adenosine - June 2004  . Sleep apnea    CPAP - intermittent    Past Surgical History:  Procedure Laterality Date  . LEFT ARM SURGERY     AS A CHILD    . RIGHT SHOULDER SURGERY  1997   RECURRENT DISLOCATION AND REPAIR OF ROTATOR CUFF IN 12/2004 BY DR MERRITT  . TOTAL KNEE ARTHROPLASTY Right 05/01/2013   Procedure: RIGHT TOTAL KNEE ARTHROPLASTY;  Surgeon: Thera Flake., MD;  Location: [facility] OR;  Service: Orthopedics;  Laterality: Right;    Allergies  Allergen Reactions  . Ace Inhibitors Other (See Comments)    Cough & angioedema 04/2013.     Current Outpatient Medications on File Prior to Visit  Medication Sig Dispense Refill  . acetaminophen (TYLENOL) 650 MG suppository Place 650 mg rectally 2 (two) times daily.    Marland Kitchen albuterol (PROVENTIL HFA;VENTOLIN HFA) 108 (90 Base) MCG/ACT inhaler Inhale into the lungs every 6 (six) hours as needed for wheezing or shortness of breath.    . chlorthalidone (HYGROTON) 25 MG tablet Take 12.5 mg by mouth daily.    . fluticasone (FLONASE) 50 MCG/ACT nasal spray Place into both nostrils daily.    Marland Kitchen loratadine (CLARITIN) 10 MG tablet Take 10 mg by mouth daily.     Marland Kitchen losartan  (COZAAR) 100 MG tablet Take 100 mg by mouth daily.    . metoprolol (TOPROL-XL) 50 MG 24 hr tablet Take 50 mg by mouth daily.      . pantoprazole (PROTONIX) 40 MG tablet Take 40 mg by mouth daily.    . potassium chloride SA (K-DUR,KLOR-CON) 20 MEQ tablet Take 20 mEq by mouth daily.    . ranitidine (ZANTAC) 150 MG tablet Take 1 tablet by mouth daily.  1   No current facility-administered medications on file prior to visit.         Objective:   Physical Exam Blood pressure 132/62, pulse (!) 56, temperature 97.7 F (36.5 C), height 5' 9.5" (1.765 m), weight 271 lb 4.8 oz (123.1 kg).  Alert and oriented. Skin warm and dry. Oral mucosa is moist.   . Sclera anicteric, conjunctivae is pink. Thyroid not enlarged. No cervical lymphadenopathy. Lungs clear. Heart regular rate and rhythm.  Abdomen is soft. Bowel sounds are positive. No hepatomegaly. No abdominal masses felt. No tenderness.  No edema to lower extremities.        Assessment & Plan:  Dysphagia. Am going to get a DG Esophagram.. Further recommendations to follow.

## 2017-10-21 NOTE — Patient Instructions (Signed)
DG esphagram.   

## 2017-11-06 ENCOUNTER — Ambulatory Visit (HOSPITAL_COMMUNITY)
Admission: RE | Admit: 2017-11-06 | Discharge: 2017-11-06 | Disposition: A | Discharge status: Home / Self Care | Payer: Federal, State, Local not specified - PPO | Source: Ambulatory Visit | Attending: Internal Medicine | Admitting: Internal Medicine

## 2017-11-06 DIAGNOSIS — R1319 Other dysphagia: Secondary | ICD-10-CM

## 2017-11-06 DIAGNOSIS — R1310 Dysphagia, unspecified: Secondary | ICD-10-CM | POA: Diagnosis not present

## 2017-11-06 DIAGNOSIS — K224 Dyskinesia of esophagus: Secondary | ICD-10-CM | POA: Insufficient documentation

## 2017-11-06 DIAGNOSIS — K228 Other specified diseases of esophagus: Secondary | ICD-10-CM | POA: Diagnosis not present

## 2017-11-08 DIAGNOSIS — G4733 Obstructive sleep apnea (adult) (pediatric): Secondary | ICD-10-CM | POA: Diagnosis not present

## 2017-11-26 DIAGNOSIS — D485 Neoplasm of uncertain behavior of skin: Secondary | ICD-10-CM | POA: Diagnosis not present

## 2017-11-26 DIAGNOSIS — D225 Melanocytic nevi of trunk: Secondary | ICD-10-CM | POA: Diagnosis not present

## 2017-11-26 DIAGNOSIS — L570 Actinic keratosis: Secondary | ICD-10-CM | POA: Diagnosis not present

## 2018-01-06 DIAGNOSIS — K080 Exfoliation of teeth due to systemic causes: Secondary | ICD-10-CM | POA: Diagnosis not present

## 2018-02-10 DIAGNOSIS — G4733 Obstructive sleep apnea (adult) (pediatric): Secondary | ICD-10-CM | POA: Diagnosis not present

## 2018-02-18 DIAGNOSIS — M722 Plantar fascial fibromatosis: Secondary | ICD-10-CM | POA: Diagnosis not present

## 2018-02-18 DIAGNOSIS — M79671 Pain in right foot: Secondary | ICD-10-CM | POA: Diagnosis not present

## 2018-03-03 DIAGNOSIS — J329 Chronic sinusitis, unspecified: Secondary | ICD-10-CM | POA: Diagnosis not present

## 2018-03-03 DIAGNOSIS — Z6839 Body mass index (BMI) 39.0-39.9, adult: Secondary | ICD-10-CM | POA: Diagnosis not present

## 2018-03-11 DIAGNOSIS — M722 Plantar fascial fibromatosis: Secondary | ICD-10-CM | POA: Diagnosis not present

## 2018-03-11 DIAGNOSIS — M79671 Pain in right foot: Secondary | ICD-10-CM | POA: Diagnosis not present

## 2018-03-17 DIAGNOSIS — R7301 Impaired fasting glucose: Secondary | ICD-10-CM | POA: Diagnosis not present

## 2018-03-17 DIAGNOSIS — K219 Gastro-esophageal reflux disease without esophagitis: Secondary | ICD-10-CM | POA: Diagnosis not present

## 2018-03-17 DIAGNOSIS — I10 Essential (primary) hypertension: Secondary | ICD-10-CM | POA: Diagnosis not present

## 2018-03-17 DIAGNOSIS — E782 Mixed hyperlipidemia: Secondary | ICD-10-CM | POA: Diagnosis not present

## 2018-03-17 DIAGNOSIS — R42 Dizziness and giddiness: Secondary | ICD-10-CM | POA: Diagnosis not present

## 2018-03-19 DIAGNOSIS — I10 Essential (primary) hypertension: Secondary | ICD-10-CM | POA: Diagnosis not present

## 2018-03-19 DIAGNOSIS — J301 Allergic rhinitis due to pollen: Secondary | ICD-10-CM | POA: Diagnosis not present

## 2018-03-19 DIAGNOSIS — E8881 Metabolic syndrome: Secondary | ICD-10-CM | POA: Diagnosis not present

## 2018-03-19 DIAGNOSIS — E782 Mixed hyperlipidemia: Secondary | ICD-10-CM | POA: Diagnosis not present

## 2018-04-21 ENCOUNTER — Ambulatory Visit: Payer: Self-pay | Admitting: Cardiology

## 2018-06-27 ENCOUNTER — Ambulatory Visit: Payer: Federal, State, Local not specified - PPO | Admitting: Cardiology

## 2018-07-10 DIAGNOSIS — R0902 Hypoxemia: Secondary | ICD-10-CM | POA: Diagnosis not present

## 2018-07-14 DIAGNOSIS — R7301 Impaired fasting glucose: Secondary | ICD-10-CM | POA: Diagnosis not present

## 2018-07-14 DIAGNOSIS — E8881 Metabolic syndrome: Secondary | ICD-10-CM | POA: Diagnosis not present

## 2018-07-14 DIAGNOSIS — I10 Essential (primary) hypertension: Secondary | ICD-10-CM | POA: Diagnosis not present

## 2018-07-14 DIAGNOSIS — E782 Mixed hyperlipidemia: Secondary | ICD-10-CM | POA: Diagnosis not present

## 2018-07-14 DIAGNOSIS — K219 Gastro-esophageal reflux disease without esophagitis: Secondary | ICD-10-CM | POA: Diagnosis not present

## 2018-07-18 DIAGNOSIS — E8881 Metabolic syndrome: Secondary | ICD-10-CM | POA: Diagnosis not present

## 2018-07-18 DIAGNOSIS — I10 Essential (primary) hypertension: Secondary | ICD-10-CM | POA: Diagnosis not present

## 2018-07-18 DIAGNOSIS — E782 Mixed hyperlipidemia: Secondary | ICD-10-CM | POA: Diagnosis not present

## 2018-07-18 DIAGNOSIS — J301 Allergic rhinitis due to pollen: Secondary | ICD-10-CM | POA: Diagnosis not present

## 2018-07-31 DIAGNOSIS — H9313 Tinnitus, bilateral: Secondary | ICD-10-CM | POA: Diagnosis not present

## 2018-07-31 DIAGNOSIS — H833X3 Noise effects on inner ear, bilateral: Secondary | ICD-10-CM | POA: Diagnosis not present

## 2018-07-31 DIAGNOSIS — H903 Sensorineural hearing loss, bilateral: Secondary | ICD-10-CM | POA: Diagnosis not present

## 2018-08-18 DIAGNOSIS — G4733 Obstructive sleep apnea (adult) (pediatric): Secondary | ICD-10-CM | POA: Diagnosis not present

## 2018-09-14 NOTE — Progress Notes (Signed)
Cardiology Office Note  Date: 09/15/2018   ID: Alan Swanson, DOB 11/15/1955, MRN [id number]  PCP:  Alan Chimeraaniel, Terry G, MD  Cardiologist:  Alan DellSamuel McDowell, MD Electrophysiologist:  None   Chief Complaint  Patient presents with  . Cardiac follow-up    History of Present Illness: Alan Swanson is a 63 y.o. male last seen in October 2018.  He presents for a follow-up visit.  He tells me that he has had one episode of palpitations that lasted almost 30 minutes earlier this year, but otherwise tends to have brief palpitations and has had no ER visits.  He continues on Toprol-XL.  He follows with Dr. Reuel Swanson, reports having a physical with lab work a few months ago, we will request the results to review.  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  He does state that he is contemplating left knee surgery, has had ongoing orthopedic evaluation and prior right knee surgery.  He is trying to lose some weight and may be pursuing this as early as 6 to 8 months.  I personally reviewed his ECG today which shows sinus bradycardia with lead artifact.  Past Medical History:  Diagnosis Date  . Anxiety disorder   . Arthritis   . Depression   . Elevated CPK    Chronically elevated  . Essential hypertension   . GERD (gastroesophageal reflux disease)   . History of kidney stones   . Obesity   . Palpitations   . PSVT (paroxysmal supraventricular tachycardia) (HCC)    Adenosine - June 2004  . Sleep apnea    CPAP - intermittent    Past Surgical History:  Procedure Laterality Date  . LEFT ARM SURGERY     AS A CHILD    . RIGHT SHOULDER SURGERY  1997   RECURRENT DISLOCATION AND REPAIR OF ROTATOR CUFF IN 12/2004 BY DR Swanson  . TOTAL KNEE ARTHROPLASTY Right 05/01/2013   Procedure: RIGHT TOTAL KNEE ARTHROPLASTY;  Surgeon: Alan FlakeW D Caffrey Jr., MD;  Location: [facility] OR;  Service: Orthopedics;  Laterality: Right;    Current Outpatient Medications  Medication Sig Dispense Refill   . acetaminophen (TYLENOL) 650 MG suppository Place 650 mg rectally 2 (two) times daily.    Marland Kitchen. albuterol (PROVENTIL HFA;VENTOLIN HFA) 108 (90 Base) MCG/ACT inhaler Inhale into the lungs every 6 (six) hours as needed for wheezing or shortness of breath.    . chlorthalidone (HYGROTON) 25 MG tablet Take 12.5 mg by mouth daily.    . famotidine (PEPCID) 20 MG tablet Take 20 mg by mouth 2 (two) times daily.    . fluticasone (FLONASE) 50 MCG/ACT nasal spray Place into both nostrils daily.    Marland Kitchen. loratadine (CLARITIN) 10 MG tablet Take 10 mg by mouth daily.     Marland Kitchen. losartan (COZAAR) 100 MG tablet Take 100 mg by mouth daily.    . metoprolol (TOPROL-XL) 50 MG 24 hr tablet Take 50 mg by mouth daily.      . pantoprazole (PROTONIX) 40 MG tablet Take 40 mg by mouth daily.    . potassium chloride SA (K-DUR,KLOR-CON) 20 MEQ tablet Take 20 mEq by mouth daily.     No current facility-administered medications for this visit.    Allergies:  Ace inhibitors   Social History: The patient  reports that he has never smoked. He has never used smokeless tobacco. He reports that he does not drink alcohol or use drugs.   ROS:  Please see the history of present illness.  Otherwise, complete review of systems is positive for chronic left knee pain.  All other systems are reviewed and negative.   Physical Exam: VS:  BP 130/70   Pulse (!) 55   Temp 98 F (36.7 C)   Ht 5' 9.5" (1.765 m)   Wt 275 lb (124.7 kg)   SpO2 98%   BMI 40.03 kg/m , BMI Body mass index is 40.03 kg/m.  Wt Readings from Last 3 Encounters:  09/15/18 275 lb (124.7 kg)  10/21/17 271 lb 4.8 oz (123.1 kg)  11/12/16 278 lb 6.4 oz (126.3 kg)    General: Obese male, appears comfortable at rest. HEENT: Conjunctiva and lids normal, oropharynx clear. Neck: Supple, no elevated JVP or carotid bruits, no thyromegaly. Lungs: Clear to auscultation, nonlabored breathing at rest. Cardiac: Regular rate and rhythm, no S3 or significant systolic murmur. Abdomen:  Obese, nontender, bowel sounds present. Extremities: No pitting edema, distal pulses 2+. Skin: Warm and dry. Musculoskeletal: No kyphosis. Neuropsychiatric: Alert and oriented x3, affect grossly appropriate.  ECG:  An ECG dated 11/12/2016 was personally reviewed today and demonstrated:  Sinus rhythm with IVCD and PVC's.  Recent Labwork:  June 2017: Hemoglobin 14.5, platelets 184, BUN 17, creatinine 1.24, potassium 4.3, AST 41, ALT 32, cholesterol 152, triglycerides 137, HDL 33, LDL 92, hemoglobin A1c 6.1, TSH 2.26   Other Studies Reviewed Today:  No interval cardiac testing for review.  Assessment and Plan:  1.  PSVT.  He continues on Toprol-XL at 50 mg daily, ECG today shows sinus bradycardia.  He does not report any escalating symptoms, no ER visits.  He remains comfortable with observation for now.  I have talked with him about possibility of EP evaluation if symptoms worsen.  2.  Essential hypertension.  Systolic blood pressures in the 130s today.  He otherwise continues on Cozaar and chlorthalidone with potassium supplements under the direction of Dr. Quillian Quince.  Requesting interval lab work for review.  3.  Contemplating left knee surgery possibly in the next 6 to 8 months.  He continues to follow with orthopedist and is trying to lose some weight.  We will tentatively plan to see him back in 6 months for preoperative assessment.  Medication Adjustments/Labs and Tests Ordered: Current medicines are reviewed at length with the patient today.  Concerns regarding medicines are outlined above.   Tests Ordered: Orders Placed This Encounter  Procedures  . EKG 12-Lead    Medication Changes: No orders of the defined types were placed in this encounter.   Disposition:  Follow up 6 months in the [place] office.  Signed, Satira Sark, MD, [facility] 09/15/2018 8:58 AM    [place] at [place], [place], [place] [address] Phone: [phone]; Fax:  [phone]

## 2018-09-15 ENCOUNTER — Ambulatory Visit: Payer: Federal, State, Local not specified - PPO | Admitting: Cardiology

## 2018-09-15 ENCOUNTER — Encounter: Payer: Self-pay | Admitting: *Deleted

## 2018-09-15 ENCOUNTER — Encounter: Payer: Self-pay | Admitting: Cardiology

## 2018-09-15 ENCOUNTER — Other Ambulatory Visit: Payer: Self-pay

## 2018-09-15 VITALS — BP 130/70 | HR 55 | Temp 98.0°F | Ht 69.5 in | Wt 275.0 lb

## 2018-09-15 DIAGNOSIS — I10 Essential (primary) hypertension: Secondary | ICD-10-CM | POA: Diagnosis not present

## 2018-09-15 DIAGNOSIS — I471 Supraventricular tachycardia: Secondary | ICD-10-CM

## 2018-09-15 DIAGNOSIS — Z01810 Encounter for preprocedural cardiovascular examination: Secondary | ICD-10-CM

## 2018-09-15 NOTE — Patient Instructions (Signed)

## 2018-09-22 DIAGNOSIS — G4733 Obstructive sleep apnea (adult) (pediatric): Secondary | ICD-10-CM | POA: Diagnosis not present

## 2018-11-26 DIAGNOSIS — D239 Other benign neoplasm of skin, unspecified: Secondary | ICD-10-CM | POA: Diagnosis not present

## 2018-11-26 DIAGNOSIS — L570 Actinic keratosis: Secondary | ICD-10-CM | POA: Diagnosis not present

## 2018-11-26 DIAGNOSIS — L821 Other seborrheic keratosis: Secondary | ICD-10-CM | POA: Diagnosis not present

## 2018-12-01 DIAGNOSIS — Z0001 Encounter for general adult medical examination with abnormal findings: Secondary | ICD-10-CM | POA: Diagnosis not present

## 2018-12-04 DIAGNOSIS — R7301 Impaired fasting glucose: Secondary | ICD-10-CM | POA: Diagnosis not present

## 2018-12-04 DIAGNOSIS — K219 Gastro-esophageal reflux disease without esophagitis: Secondary | ICD-10-CM | POA: Diagnosis not present

## 2018-12-04 DIAGNOSIS — Z0001 Encounter for general adult medical examination with abnormal findings: Secondary | ICD-10-CM | POA: Diagnosis not present

## 2018-12-04 DIAGNOSIS — I10 Essential (primary) hypertension: Secondary | ICD-10-CM | POA: Diagnosis not present

## 2018-12-04 DIAGNOSIS — Z23 Encounter for immunization: Secondary | ICD-10-CM | POA: Diagnosis not present

## 2018-12-04 DIAGNOSIS — Z1212 Encounter for screening for malignant neoplasm of rectum: Secondary | ICD-10-CM | POA: Diagnosis not present

## 2018-12-04 DIAGNOSIS — H9190 Unspecified hearing loss, unspecified ear: Secondary | ICD-10-CM | POA: Diagnosis not present

## 2018-12-22 DIAGNOSIS — G4733 Obstructive sleep apnea (adult) (pediatric): Secondary | ICD-10-CM | POA: Diagnosis not present

## 2019-02-17 DIAGNOSIS — H903 Sensorineural hearing loss, bilateral: Secondary | ICD-10-CM | POA: Diagnosis not present

## 2019-02-25 NOTE — Progress Notes (Signed)
Virtual Visit via Telephone Note   This visit type was conducted due to national recommendations for restrictions regarding the COVID-19 Pandemic (e.g. social distancing) in an effort to limit this patient's exposure and mitigate transmission in our community.  Due to his co-morbid illnesses, this patient is at least at moderate risk for complications without adequate follow up.  This format is felt to be most appropriate for this patient at this time.  The patient did not have access to video technology/had technical difficulties with video requiring transitioning to audio format only (telephone).  All issues noted in this document were discussed and addressed.  No physical exam could be performed with this format.  Please refer to the patient's chart for his  consent to telehealth for [facility].   Date:  02/26/2019   ID:  Alan Swanson, DOB [birth year], MRN [id number]  Patient Location: Home Provider Location: Office  PCP:  Richardean Chimera, MD  Cardiologist:  Nona Dell, MD Electrophysiologist:  None   Evaluation Performed:  Follow-Up Visit  Chief Complaint:  Cardiac follow-up  History of Present Illness:    Alan Swanson is a 64 y.o. male last seen in August 2020.  We spoke by phone today.  He tells me that he has been doing well, no significant palpitations or chest pain.  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  I reviewed his medications.  He is doing well on Toprol-XL.  He continues to follow-up with Dr. Reuel Boom for primary care.  The patient does not have symptoms concerning for COVID-19 infection (fever, chills, cough, or new shortness of breath).  We discussed the vaccine today, he does plan to get it.   Past Medical History:  Diagnosis Date  . Anxiety disorder   . Arthritis   . Depression   . Elevated CPK    Chronically elevated  . Essential hypertension   . GERD (gastroesophageal reflux disease)   . History of kidney stones    . Obesity   . Palpitations   . PSVT (paroxysmal supraventricular tachycardia) (HCC)    Adenosine - June 2004  . Sleep apnea    CPAP - intermittent   Past Surgical History:  Procedure Laterality Date  . LEFT ARM SURGERY     AS A CHILD    . RIGHT SHOULDER SURGERY  1997   RECURRENT DISLOCATION AND REPAIR OF ROTATOR CUFF IN 12/2004 BY DR MERRITT  . TOTAL KNEE ARTHROPLASTY Right 05/01/2013   Procedure: RIGHT TOTAL KNEE ARTHROPLASTY;  Surgeon: Thera Flake., MD;  Location: [facility];  Service: Orthopedics;  Laterality: Right;     Current Meds  Medication Sig  . acetaminophen (TYLENOL) 650 MG CR tablet Take 650 mg by mouth 2 (two) times daily.  Marland Kitchen albuterol (PROVENTIL HFA;VENTOLIN HFA) 108 (90 Base) MCG/ACT inhaler Inhale into the lungs every 6 (six) hours as needed for wheezing or shortness of breath.  . Ascorbic Acid 500 MG TBCR Take 1 tablet by mouth daily.  . chlorthalidone (HYGROTON) 25 MG tablet Take 12.5 mg by mouth daily.  . cholecalciferol (VITAMIN D3) 25 MCG (1000 UNIT) tablet Take 1,000 Units by mouth daily.  . diclofenac Sodium (VOLTAREN) 1 % GEL Apply 1 application topically 2 (two) times daily.  . famotidine (PEPCID) 20 MG tablet Take 20 mg by mouth 3 (three) times daily.   . fluticasone (FLONASE) 50 MCG/ACT nasal spray Place into both nostrils daily.  Marland Kitchen loratadine (CLARITIN) 10 MG tablet Take 10 mg  by mouth daily.   Marland Kitchen losartan (COZAAR) 100 MG tablet Take 100 mg by mouth daily.  . metoprolol (TOPROL-XL) 50 MG 24 hr tablet Take 50 mg by mouth daily.    . pantoprazole (PROTONIX) 40 MG tablet Take 40 mg by mouth daily.  . potassium chloride SA (K-DUR,KLOR-CON) 20 MEQ tablet Take 20 mEq by mouth daily.  . Zinc 50 MG TABS Take 1 tablet by mouth daily.     Allergies:   Ace inhibitors   Social History   Tobacco Use  . Smoking status: Never Smoker  . Smokeless tobacco: Never Used  Substance Use Topics  . Alcohol use: No    Alcohol/week: 0.0 standard drinks  . Drug use: No      Family Hx: The patient's family history includes Aneurysm in his mother; COPD in his father; Cancer in an other family member; Heart disease in his mother and another family member.  ROS:   Please see the history of present illness. All other systems reviewed and are negative.   Prior CV studies:   The following studies were reviewed today:  No interval cardiac testing for review.  Labs/Other Tests and Data Reviewed:    EKG:  An ECG dated 09/15/2018 was personally reviewed today and demonstrated:  Sinus bradycardia with lead artifact.  Recent Labs:  June 2020: BUN 18, creatinine 1.31, potassium 3.8, AST 43, ALT 30, cholesterol 166, triglycerides 185, HDL 32, LDL 97, TSH 4.59  Wt Readings from Last 3 Encounters:  02/26/19 273 lb (123.8 kg)  09/15/18 275 lb (124.7 kg)  10/21/17 271 lb 4.8 oz (123.1 kg)     Objective:    Vital Signs:  BP 124/71   Pulse (!) 57   Ht 5' 9.5" (1.765 m)   Wt 273 lb (123.8 kg)   BMI 39.74 kg/m    Patient spoke in full sentences, not short of breath. No audible wheezing or coughing. Speech pattern normal.  ASSESSMENT & PLAN:    1.  History of PSVT, well controlled on Toprol-XL 50 mg daily.  Plan to continue with observation for now.  2.  Essential hypertension, blood pressure is well controlled today.  In addition to Toprol-XL he is also on chlorthalidone with potassium supplements and Cozaar.  No changes made today.   Time:   Today, I have spent 5 minutes with the patient with telehealth technology discussing the above problems.     Medication Adjustments/Labs and Tests Ordered: Current medicines are reviewed at length with the patient today.  Concerns regarding medicines are outlined above.   Tests Ordered: No orders of the defined types were placed in this encounter.   Medication Changes: No orders of the defined types were placed in this encounter.   Follow Up:  In Person 1 year in the [place] office.  Signed, Rozann Lesches, MD  02/26/2019 9:56 AM    [facility]

## 2019-02-26 ENCOUNTER — Encounter: Payer: Self-pay | Admitting: Cardiology

## 2019-02-26 ENCOUNTER — Telehealth (INDEPENDENT_AMBULATORY_CARE_PROVIDER_SITE_OTHER): Payer: Federal, State, Local not specified - PPO | Admitting: Cardiology

## 2019-02-26 VITALS — BP 124/71 | HR 57 | Ht 69.5 in | Wt 273.0 lb

## 2019-02-26 DIAGNOSIS — I10 Essential (primary) hypertension: Secondary | ICD-10-CM

## 2019-02-26 DIAGNOSIS — I471 Supraventricular tachycardia: Secondary | ICD-10-CM | POA: Diagnosis not present

## 2019-02-26 NOTE — Patient Instructions (Addendum)

## 2019-03-05 DIAGNOSIS — Z23 Encounter for immunization: Secondary | ICD-10-CM | POA: Diagnosis not present

## 2019-04-02 DIAGNOSIS — Z23 Encounter for immunization: Secondary | ICD-10-CM | POA: Diagnosis not present

## 2019-05-04 DIAGNOSIS — J0101 Acute recurrent maxillary sinusitis: Secondary | ICD-10-CM | POA: Diagnosis not present

## 2019-05-04 DIAGNOSIS — J209 Acute bronchitis, unspecified: Secondary | ICD-10-CM | POA: Diagnosis not present

## 2019-05-25 DIAGNOSIS — E782 Mixed hyperlipidemia: Secondary | ICD-10-CM | POA: Diagnosis not present

## 2019-05-25 DIAGNOSIS — I10 Essential (primary) hypertension: Secondary | ICD-10-CM | POA: Diagnosis not present

## 2019-05-25 DIAGNOSIS — E8881 Metabolic syndrome: Secondary | ICD-10-CM | POA: Diagnosis not present

## 2019-05-25 DIAGNOSIS — R7301 Impaired fasting glucose: Secondary | ICD-10-CM | POA: Diagnosis not present

## 2019-05-25 DIAGNOSIS — E6609 Other obesity due to excess calories: Secondary | ICD-10-CM | POA: Diagnosis not present

## 2019-05-28 DIAGNOSIS — I10 Essential (primary) hypertension: Secondary | ICD-10-CM | POA: Diagnosis not present

## 2019-05-28 DIAGNOSIS — E782 Mixed hyperlipidemia: Secondary | ICD-10-CM | POA: Diagnosis not present

## 2019-05-28 DIAGNOSIS — K219 Gastro-esophageal reflux disease without esophagitis: Secondary | ICD-10-CM | POA: Diagnosis not present

## 2019-05-28 DIAGNOSIS — E8881 Metabolic syndrome: Secondary | ICD-10-CM | POA: Diagnosis not present

## 2019-08-19 DIAGNOSIS — G4733 Obstructive sleep apnea (adult) (pediatric): Secondary | ICD-10-CM | POA: Diagnosis not present

## 2019-08-21 DIAGNOSIS — G4733 Obstructive sleep apnea (adult) (pediatric): Secondary | ICD-10-CM | POA: Diagnosis not present

## 2019-12-10 DIAGNOSIS — Z23 Encounter for immunization: Secondary | ICD-10-CM | POA: Diagnosis not present

## 2019-12-14 DIAGNOSIS — E8881 Metabolic syndrome: Secondary | ICD-10-CM | POA: Diagnosis not present

## 2019-12-14 DIAGNOSIS — R7301 Impaired fasting glucose: Secondary | ICD-10-CM | POA: Diagnosis not present

## 2019-12-14 DIAGNOSIS — Z0001 Encounter for general adult medical examination with abnormal findings: Secondary | ICD-10-CM | POA: Diagnosis not present

## 2019-12-14 DIAGNOSIS — Z125 Encounter for screening for malignant neoplasm of prostate: Secondary | ICD-10-CM | POA: Diagnosis not present

## 2019-12-14 DIAGNOSIS — E782 Mixed hyperlipidemia: Secondary | ICD-10-CM | POA: Diagnosis not present

## 2019-12-14 DIAGNOSIS — I10 Essential (primary) hypertension: Secondary | ICD-10-CM | POA: Diagnosis not present

## 2019-12-16 DIAGNOSIS — E8881 Metabolic syndrome: Secondary | ICD-10-CM | POA: Diagnosis not present

## 2019-12-16 DIAGNOSIS — I10 Essential (primary) hypertension: Secondary | ICD-10-CM | POA: Diagnosis not present

## 2019-12-16 DIAGNOSIS — Z0001 Encounter for general adult medical examination with abnormal findings: Secondary | ICD-10-CM | POA: Diagnosis not present

## 2019-12-16 DIAGNOSIS — Z1212 Encounter for screening for malignant neoplasm of rectum: Secondary | ICD-10-CM | POA: Diagnosis not present

## 2019-12-16 DIAGNOSIS — E782 Mixed hyperlipidemia: Secondary | ICD-10-CM | POA: Diagnosis not present

## 2019-12-16 DIAGNOSIS — R7301 Impaired fasting glucose: Secondary | ICD-10-CM | POA: Diagnosis not present

## 2020-02-09 IMAGING — RF DG ESOPHAGUS
12 of 17 series · 12 of 24 positions shown · non-contrast
Comparison: None

CLINICAL DATA: Get strangled when drinking water and swallowing
pills, reflux history

EXAM:
ESOPHOGRAM / BARIUM SWALLOW / BARIUM TABLET STUDY
TECHNIQUE: Combined double contrast and single contrast examination performed
using effervescent crystals, thick barium liquid, and thin barium
liquid. The patient was observed with fluoroscopy swallowing a 13 mm
barium sulphate tablet.
FLUOROSCOPY TIME:  Fluoroscopy Time:  1 minutes 36 seconds
Radiation Exposure Index (if provided by the fluoroscopic device):
53.9 mGy
Number of Acquired Spot Images: multiple fluoroscopic screen
captures

[Series 1: cp_standard · 0.17mm/px · 1 of 12 frames shown (1 of 12)]
[frame 11/12]
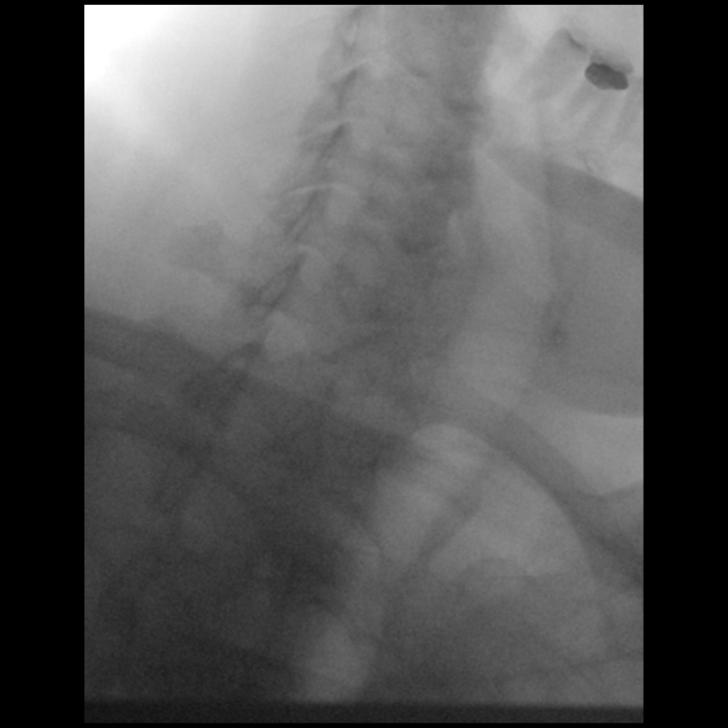

[Series 3: cp_standard · 0.17mm/px · 1 of 32 frames shown (2 of 12)]
[frame 17/32]
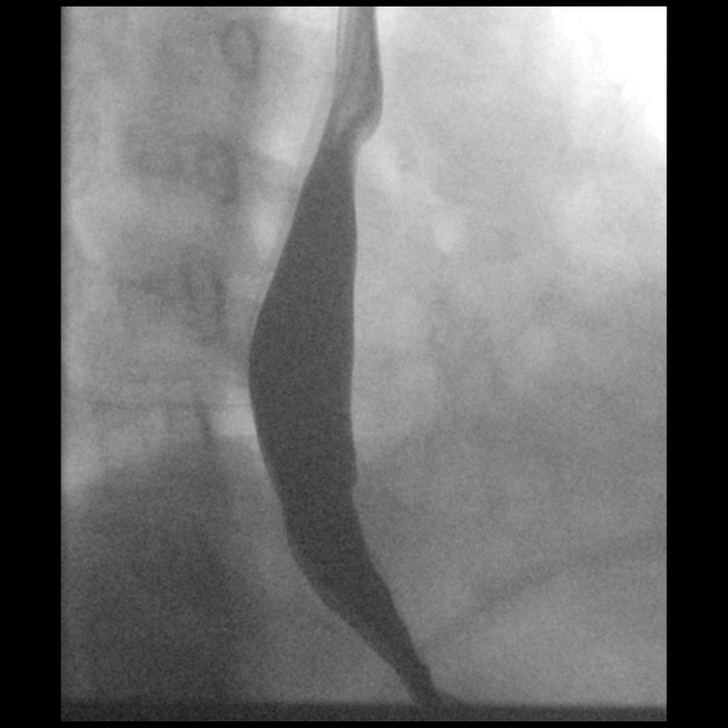

[Series 4: cp_standard · 0.17mm/px · 1 of 116 frames shown (3 of 12)]
[frame 99/116]
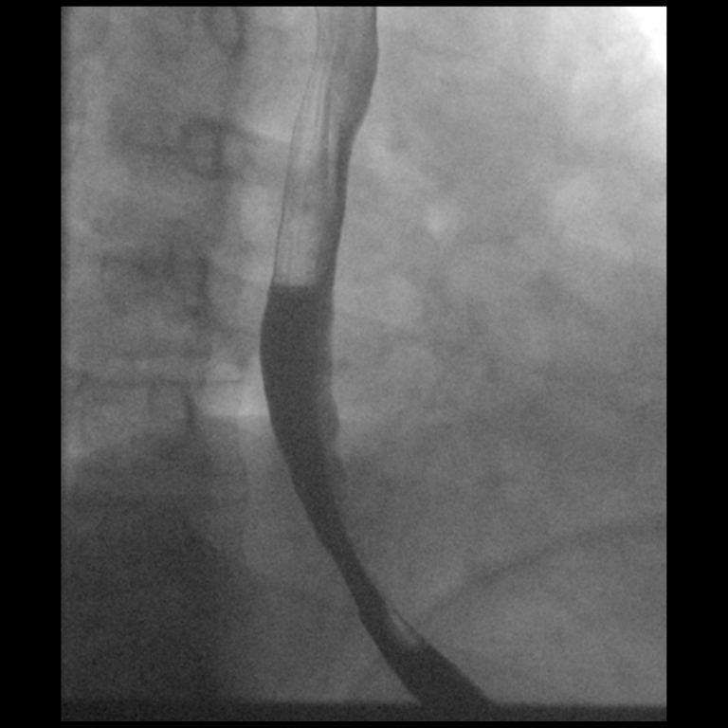

[Series 6: cp_standard · 0.17mm/px · 1 of 88 frames shown (4 of 12)]
[frame 14/88]
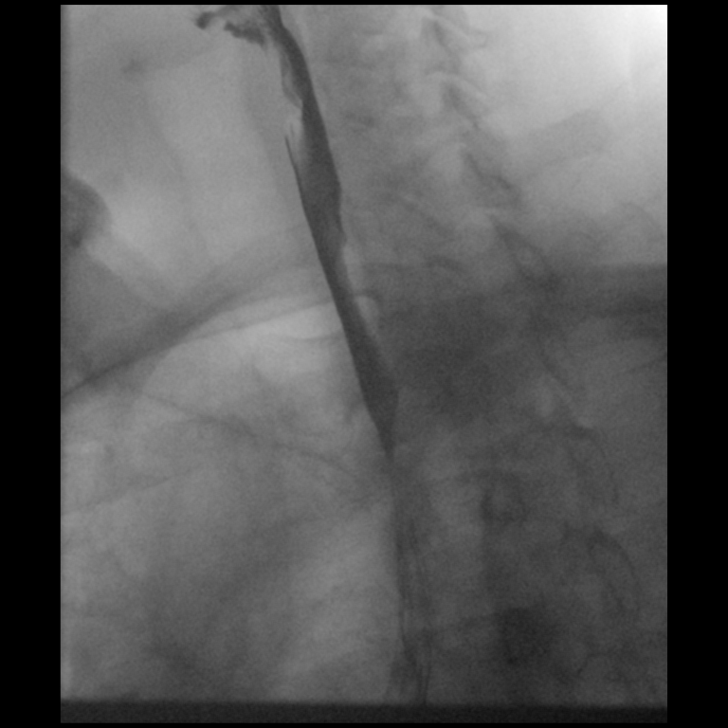

[Series 7: cp_standard · 0.17mm/px · 1 of 16 frames shown (5 of 12)]
[frame 14/16]
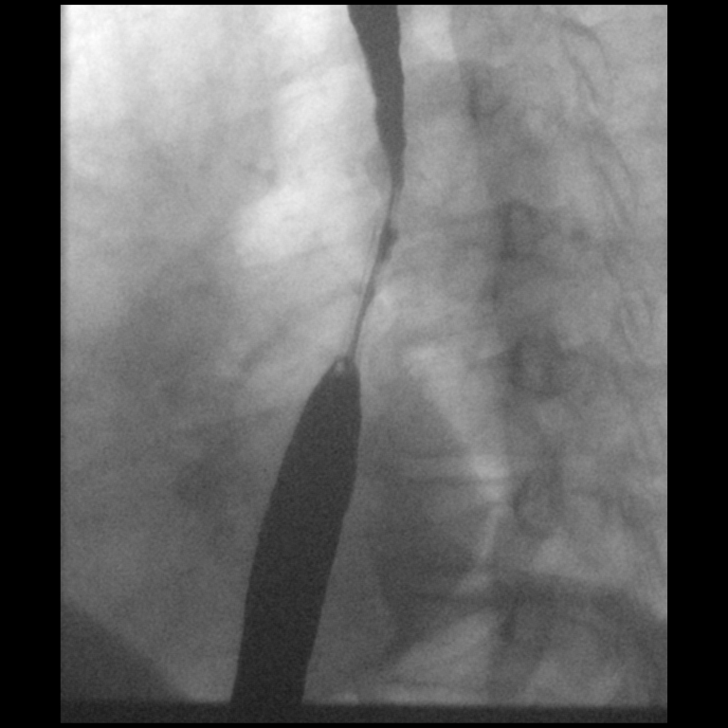

[Series 9: cp_standard · 0.17mm/px · 1 of 33 frames shown (6 of 12)]
[frame 17/33]
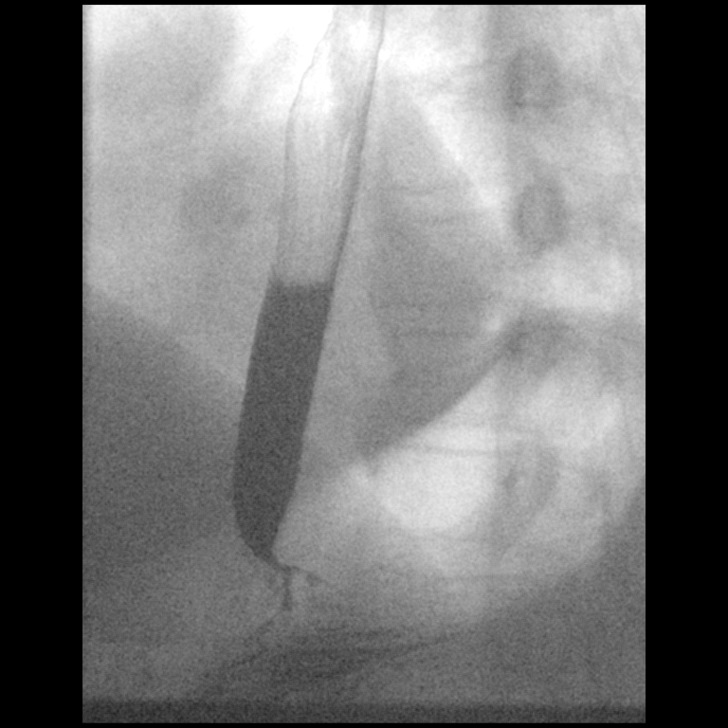

[Series 10: cp_standard · 0.27mm/px · 1 of 138 frames shown (7 of 12)]
[frame 70/138]
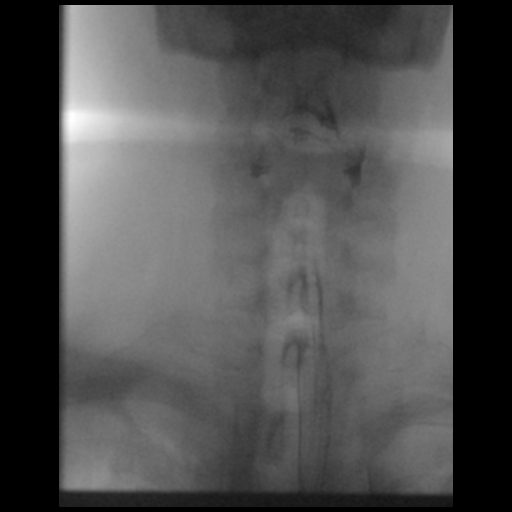

[Series 12: cp_standard · 0.25mm/px · 1 of 139 frames shown (8 of 12)]
[frame 1/139]
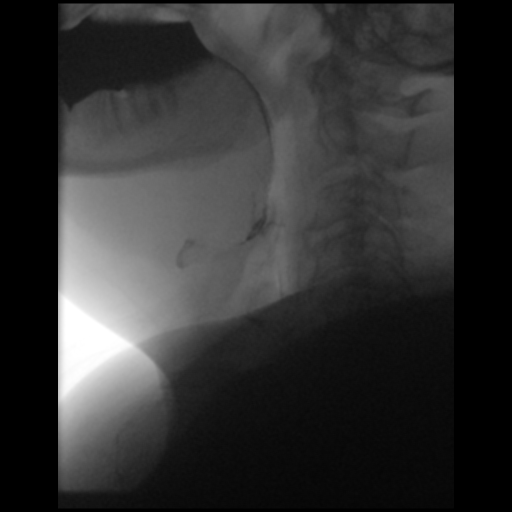

[Series 13: cp_standard · 0.25mm/px · 1 of 112 frames shown (9 of 12)]
[frame 21/112]
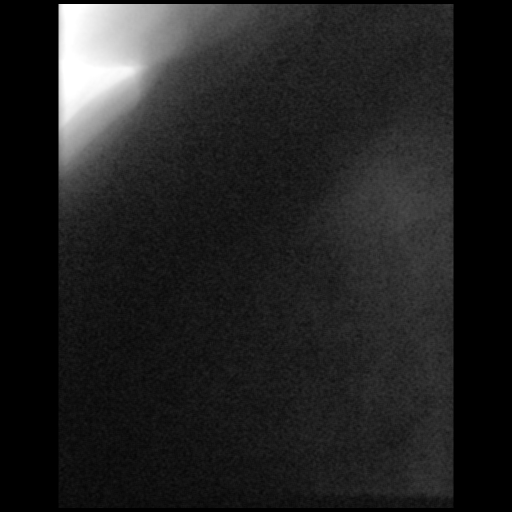

[Series 14: cp_standard · 0.18mm/px · 1 of 9 frames shown (10 of 12)]
[frame 9/9]
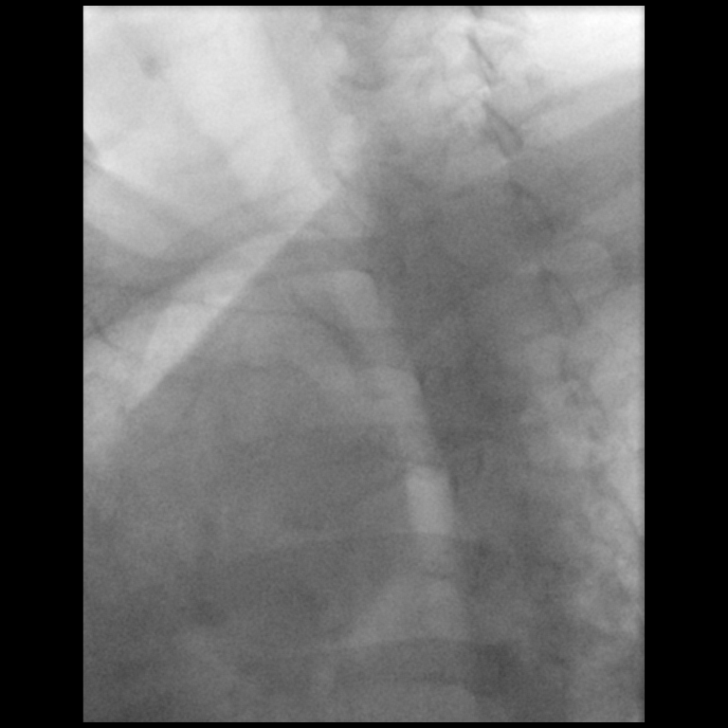

[Series 16: cp_standard · 0.18mm/px · 1 of 10 frames shown (11 of 12)]
[frame 2/10]
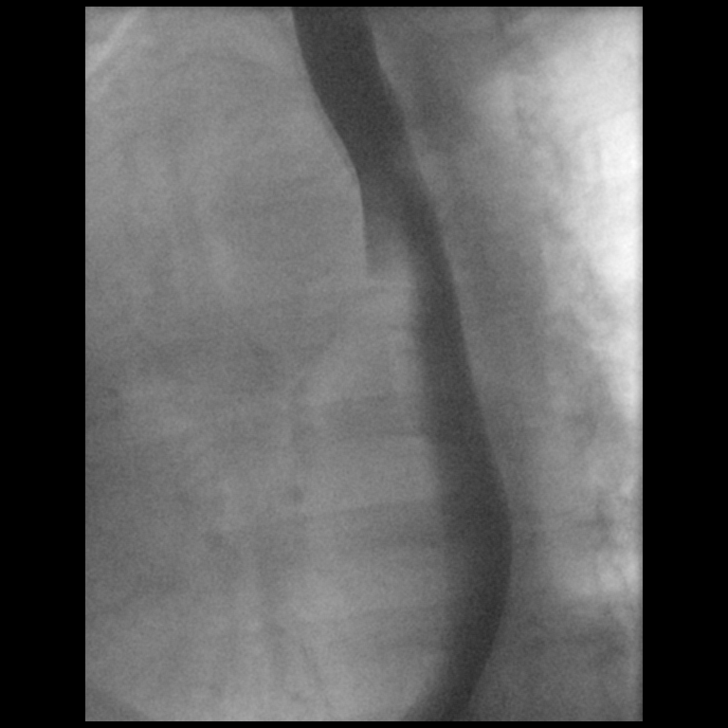

[Series 17: cp_standard · 0.18mm/px · 1 of 85 frames shown (12 of 12)]
[frame 85/85]
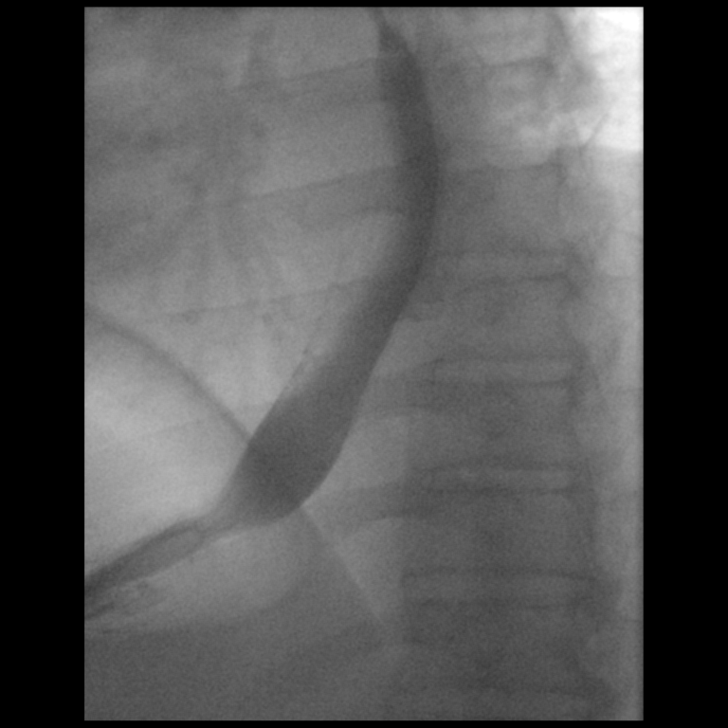

[12 of 24 positions shown; findings below may reference images not displayed]

FINDINGS: Esophageal distention: Normal distention without mass or stricture

Filling defects:  None

12.5 mm barium tablet: Easily passed from oral cavity to stomach
without delay

Motility:  Mild diffuse age-related esophageal dysmotility

Mucosa:  Smooth without irregularity or ulceration.

Hypopharynx/cervical esophagus: No laryngeal penetration,
aspiration, or residuals. Tiny LEFT lateral pharyngocele.

Hiatal hernia:  Absent

GE reflux:  Not witnessed during exam

Other:  N/A
IMPRESSION: Tiny LEFT lateral pharyngocele.

Mild age-related esophageal dysmotility without esophageal mass or
stricture.

## 2020-08-14 NOTE — Progress Notes (Deleted)
Cardiology Office Note  Date: 08/14/2020   ID: Alan Swanson, DOB 08-22-1955, MRN [id number]  PCP:  Richardean Chimera, MD  Cardiologist:  Nona Dell, MD Electrophysiologist:  None   No chief complaint on file.   History of Present Illness: Alan Swanson is a 65 y.o. male last assessed via telehealth encounter in February 2021.  Past Medical History:  Diagnosis Date   Anxiety disorder    Arthritis    Depression    Elevated CPK    Chronically elevated   Essential hypertension    GERD (gastroesophageal reflux disease)    History of kidney stones    Obesity    Palpitations    PSVT (paroxysmal supraventricular tachycardia) (HCC)    Adenosine - June 2004   Sleep apnea    CPAP - intermittent    Past Surgical History:  Procedure Laterality Date   LEFT ARM SURGERY     AS A CHILD     RIGHT SHOULDER SURGERY  1997   RECURRENT DISLOCATION AND REPAIR OF ROTATOR CUFF IN 12/2004 BY DR MERRITT   TOTAL KNEE ARTHROPLASTY Right 05/01/2013   Procedure: RIGHT TOTAL KNEE ARTHROPLASTY;  Surgeon: Thera Flake., MD;  Location: [facility] OR;  Service: Orthopedics;  Laterality: Right;    Current Outpatient Medications  Medication Sig Dispense Refill   acetaminophen (TYLENOL) 650 MG CR tablet Take 650 mg by mouth 2 (two) times daily.     albuterol (PROVENTIL HFA;VENTOLIN HFA) 108 (90 Base) MCG/ACT inhaler Inhale into the lungs every 6 (six) hours as needed for wheezing or shortness of breath.     Ascorbic Acid 500 MG TBCR Take 1 tablet by mouth daily.     chlorthalidone (HYGROTON) 25 MG tablet Take 12.5 mg by mouth daily.     cholecalciferol (VITAMIN D3) 25 MCG (1000 UNIT) tablet Take 1,000 Units by mouth daily.     diclofenac Sodium (VOLTAREN) 1 % GEL Apply 1 application topically 2 (two) times daily.     famotidine (PEPCID) 20 MG tablet Take 20 mg by mouth 3 (three) times daily.      fluticasone (FLONASE) 50 MCG/ACT nasal spray Place into both nostrils daily.     loratadine  (CLARITIN) 10 MG tablet Take 10 mg by mouth daily.      losartan (COZAAR) 100 MG tablet Take 100 mg by mouth daily.     metoprolol (TOPROL-XL) 50 MG 24 hr tablet Take 50 mg by mouth daily.       pantoprazole (PROTONIX) 40 MG tablet Take 40 mg by mouth daily.     potassium chloride SA (K-DUR,KLOR-CON) 20 MEQ tablet Take 20 mEq by mouth daily.     Zinc 50 MG TABS Take 1 tablet by mouth daily.     No current facility-administered medications for this visit.   Allergies:  Ace inhibitors   Social History: The patient  reports that he has never smoked. He has never used smokeless tobacco. He reports that he does not drink alcohol and does not use drugs.   Family History: The patient's family history includes Aneurysm in his mother; COPD in his father; Cancer in an other family member; Heart disease in his mother and another family member.   ROS:  Please see the history of present illness. Otherwise, complete review of systems is positive for {NONE DEFAULTED:18576}.  All other systems are reviewed and negative.   Physical Exam: VS:  There were no vitals taken for this visit., BMI There is  no height or weight on file to calculate BMI.  Wt Readings from Last 3 Encounters:  02/26/19 273 lb (123.8 kg)  09/15/18 275 lb (124.7 kg)  10/21/17 271 lb 4.8 oz (123.1 kg)    General: Patient appears comfortable at rest. HEENT: Conjunctiva and lids normal, oropharynx clear with moist mucosa. Neck: Supple, no elevated JVP or carotid bruits, no thyromegaly. Lungs: Clear to auscultation, nonlabored breathing at rest. Cardiac: Regular rate and rhythm, no S3 or significant systolic murmur, no pericardial rub. Abdomen: Soft, nontender, no hepatomegaly, bowel sounds present, no guarding or rebound. Extremities: No pitting edema, distal pulses 2+. Skin: Warm and dry. Musculoskeletal: No kyphosis. Neuropsychiatric: Alert and oriented x3, affect grossly appropriate.  ECG:  An ECG dated 09/15/2018 was personally  reviewed today and demonstrated:  Sinus bradycardia with lead artifact.  Recent Labwork:  No recent lab work for review.  Other Studies Reviewed Today:  No interval cardiac testing for review.  Assessment and Plan:    Medication Adjustments/Labs and Tests Ordered: Current medicines are reviewed at length with the patient today.  Concerns regarding medicines are outlined above.   Tests Ordered: No orders of the defined types were placed in this encounter.   Medication Changes: No orders of the defined types were placed in this encounter.   Disposition:  Follow up {follow up:15908}  Signed, Jonelle Sidle, MD, [facility] 08/14/2020 1:11 PM    [facility] at [facility] [address] [place], [place], [place] [address] Phone: [phone]; Fax: [phone]

## 2020-08-15 ENCOUNTER — Ambulatory Visit: Payer: Federal, State, Local not specified - PPO | Admitting: Cardiology

## 2020-08-15 DIAGNOSIS — I471 Supraventricular tachycardia: Secondary | ICD-10-CM

## 2020-12-12 NOTE — Progress Notes (Signed)
Cardiology Office Note  Date: 12/13/2020   ID: Alan Swanson, DOB [birth year], MRN [id number]  PCP:  Richardean Chimera, MD  Cardiologist:  Nona Dell, MD Electrophysiologist:  None   Chief Complaint  Patient presents with   Cardiac follow-up    History of Present Illness: Alan Swanson is a 65 y.o. male last assessed via telehealth encounter in February 2021.  He is here for a follow-up visit.  Reports no significant palpitations on current dose of Toprol-XL.  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  He thinks that he may need to have a knee replacement surgery in the spring due to progressive arthritic symptoms.  I reviewed his medications which are noted below.  He continues to follow regularly with Dr. Reuel Boom.  I personally reviewed his ECG today which shows sinus bradycardia with nonspecific T wave changes.  Past Medical History:  Diagnosis Date   Anxiety disorder    Arthritis    Depression    Elevated CPK    Chronically elevated   Essential hypertension    GERD (gastroesophageal reflux disease)    History of kidney stones    Obesity    Palpitations    PSVT (paroxysmal supraventricular tachycardia) (HCC)    Adenosine - June 2004   Sleep apnea    CPAP - intermittent    Past Surgical History:  Procedure Laterality Date   LEFT ARM SURGERY     AS A CHILD     RIGHT SHOULDER SURGERY  1997   RECURRENT DISLOCATION AND REPAIR OF ROTATOR CUFF IN 12/2004 BY DR MERRITT   TOTAL KNEE ARTHROPLASTY Right 05/01/2013   Procedure: RIGHT TOTAL KNEE ARTHROPLASTY;  Surgeon: Thera Flake., MD;  Location: [facility] OR;  Service: Orthopedics;  Laterality: Right;    Current Outpatient Medications  Medication Sig Dispense Refill   acetaminophen (TYLENOL) 650 MG CR tablet Take 650 mg by mouth 2 (two) times daily.     albuterol (PROVENTIL HFA;VENTOLIN HFA) 108 (90 Base) MCG/ACT inhaler Inhale into the lungs every 6 (six) hours as needed for  wheezing or shortness of breath.     chlorthalidone (HYGROTON) 25 MG tablet Take 12.5 mg by mouth daily.     cholecalciferol (VITAMIN D3) 25 MCG (1000 UNIT) tablet Take 1,000 Units by mouth daily.     diclofenac Sodium (VOLTAREN) 1 % GEL Apply 1 application topically 2 (two) times daily.     famotidine (PEPCID) 20 MG tablet Take 20 mg by mouth 3 (three) times daily.      fluticasone (FLONASE) 50 MCG/ACT nasal spray Place into both nostrils daily.     loratadine (CLARITIN) 10 MG tablet Take 10 mg by mouth daily.     losartan (COZAAR) 100 MG tablet Take 100 mg by mouth daily.     metoprolol (TOPROL-XL) 50 MG 24 hr tablet Take 50 mg by mouth daily.       pantoprazole (PROTONIX) 40 MG tablet Take 40 mg by mouth daily.     potassium chloride SA (K-DUR,KLOR-CON) 20 MEQ tablet Take 20 mEq by mouth daily.     Zinc 50 MG TABS Take 1 tablet by mouth daily.     No current facility-administered medications for this visit.   Allergies:  Ace inhibitors   ROS: No syncope.  Physical Exam: VS:  BP (!) 142/76   Pulse (!) 53   Resp 18   Ht 5' 9.5" (1.765 m)  Wt 285 lb (129.3 kg)   SpO2 98%   BMI 41.48 kg/m , BMI Body mass index is 41.48 kg/m.  Wt Readings from Last 3 Encounters:  12/13/20 285 lb (129.3 kg)  02/26/19 273 lb (123.8 kg)  09/15/18 275 lb (124.7 kg)    General: Patient appears comfortable at rest. HEENT: Conjunctiva and lids normal, wearing a mask. Neck: Supple, no elevated JVP or carotid bruits, no thyromegaly. Lungs: Clear to auscultation, nonlabored breathing at rest. Cardiac: Regular rate and rhythm, no S3 or significant systolic murmur, no pericardial rub. Extremities: No pitting edema.  ECG:  An ECG dated 09/15/2018 was personally reviewed today and demonstrated:  Sinus bradycardia with lead artifact.  Recent Labwork:  June 2020: BUN 18, creatinine 1.31, potassium 3.8, AST 43, ALT 30, cholesterol 166, triglycerides 185, HDL 32, LDL 97, TSH 4.59  Other Studies Reviewed  Today:  No interval cardiac testing for review today.  Assessment and Plan:  1.  PSVT, doing well on Toprol-XL 50 mg daily, no significant breakthrough events in the last few years.  ECG reviewed today.  Continue observation.  2.  Essential hypertension, blood pressure is mildly elevated today.  He is trying to lose some weight, otherwise continues on Cozaar and HCTZ with potassium supplement.  Keep follow-up with Dr. Reuel Boom.  Medication Adjustments/Labs and Tests Ordered: Current medicines are reviewed at length with the patient today.  Concerns regarding medicines are outlined above.   Tests Ordered: Orders Placed This Encounter  Procedures   EKG 12-Lead    Medication Changes: No orders of the defined types were placed in this encounter.   Disposition:  Follow up  1 year.  Signed, Jonelle Sidle, MD, [facility] 12/13/2020 9:49 AM    [facility] at [facility] [address] [place], [place], [place] [address] Phone: [phone]; Fax: [phone]

## 2020-12-13 ENCOUNTER — Encounter: Payer: Self-pay | Admitting: Cardiology

## 2020-12-13 ENCOUNTER — Ambulatory Visit: Payer: Federal, State, Local not specified - PPO | Admitting: Cardiology

## 2020-12-13 VITALS — BP 142/76 | HR 53 | Resp 18 | Ht 69.5 in | Wt 285.0 lb

## 2020-12-13 DIAGNOSIS — I471 Supraventricular tachycardia: Secondary | ICD-10-CM

## 2020-12-13 DIAGNOSIS — I10 Essential (primary) hypertension: Secondary | ICD-10-CM | POA: Diagnosis not present

## 2020-12-13 NOTE — Patient Instructions (Addendum)

## 2021-04-27 ENCOUNTER — Other Ambulatory Visit: Payer: Self-pay | Admitting: Orthopedic Surgery

## 2021-04-27 DIAGNOSIS — M5126 Other intervertebral disc displacement, lumbar region: Secondary | ICD-10-CM

## 2021-04-27 DIAGNOSIS — M4800 Spinal stenosis, site unspecified: Secondary | ICD-10-CM

## 2021-04-30 ENCOUNTER — Ambulatory Visit
Admission: RE | Admit: 2021-04-30 | Discharge: 2021-04-30 | Disposition: A | Discharge status: Home / Self Care | Payer: Federal, State, Local not specified - PPO | Source: Ambulatory Visit | Attending: Orthopedic Surgery | Admitting: Orthopedic Surgery

## 2021-04-30 DIAGNOSIS — M4800 Spinal stenosis, site unspecified: Secondary | ICD-10-CM

## 2021-04-30 DIAGNOSIS — M5126 Other intervertebral disc displacement, lumbar region: Secondary | ICD-10-CM

## 2021-10-24 DIAGNOSIS — H9190 Unspecified hearing loss, unspecified ear: Secondary | ICD-10-CM | POA: Diagnosis not present

## 2021-10-24 DIAGNOSIS — I10 Essential (primary) hypertension: Secondary | ICD-10-CM | POA: Diagnosis not present

## 2021-10-24 DIAGNOSIS — E7849 Other hyperlipidemia: Secondary | ICD-10-CM | POA: Diagnosis not present

## 2021-10-24 DIAGNOSIS — R7301 Impaired fasting glucose: Secondary | ICD-10-CM | POA: Diagnosis not present

## 2021-10-24 DIAGNOSIS — G4733 Obstructive sleep apnea (adult) (pediatric): Secondary | ICD-10-CM | POA: Diagnosis not present

## 2021-10-24 DIAGNOSIS — J301 Allergic rhinitis due to pollen: Secondary | ICD-10-CM | POA: Diagnosis not present

## 2021-10-24 DIAGNOSIS — K2100 Gastro-esophageal reflux disease with esophagitis, without bleeding: Secondary | ICD-10-CM | POA: Diagnosis not present

## 2021-10-24 DIAGNOSIS — Z23 Encounter for immunization: Secondary | ICD-10-CM | POA: Diagnosis not present

## 2022-02-14 DIAGNOSIS — L409 Psoriasis, unspecified: Secondary | ICD-10-CM | POA: Diagnosis not present

## 2022-02-14 DIAGNOSIS — L570 Actinic keratosis: Secondary | ICD-10-CM | POA: Diagnosis not present

## 2022-04-02 NOTE — Progress Notes (Unsigned)
    Cardiology Office Note  Date: 04/04/2022   ID: Alan Swanson, DOB [birth year], MRN [id number]  History of Present Illness: Alan Swanson is a 67 y.o. male last seen in November 2022.  He is here for follow-up visit.  States that he has had no significant trouble with progressive palpitations, no exertional chest pain, stable NYHA class II dyspnea.  Still going to [employer] 3 days a week to do light weights and other toning exercises.  He does report sporadic episodes of lightheadedness, not vertigo by description, has not had any associated palpitations with these episodes nor has he had syncope.  We went over his medications today, he is continued on Toprol-XL 50 mg daily along with losartan and chlorthalidone with potassium supplement.  I personally reviewed his ECG which shows sinus bradycardia at 47 bpm with right branch block.  Physical Exam: VS:  BP 131/69   Pulse (!) 47   Ht 5' 9.5" (1.765 m)   Wt 285 lb (129.3 kg)   BMI 41.48 kg/m , BMI Body mass index is 41.48 kg/m.  Wt Readings from Last 3 Encounters:  04/04/22 285 lb (129.3 kg)  12/13/20 285 lb (129.3 kg)  02/26/19 273 lb (123.8 kg)    General: Patient appears comfortable at rest. HEENT: Conjunctiva and lids normal. Neck: Supple, no elevated JVP or carotid bruits. Lungs: Clear to auscultation, nonlabored breathing at rest. Cardiac: Regular rate and rhythm, no S3 or significant systolic murmur. Extremities: No pitting edema.  ECG:  An ECG dated 12/13/2020 was personally reviewed today and demonstrated:  Sinus bradycardia with nonspecific T wave changes.  Labwork:  March 2023: Hemoglobin 14.0, platelets 165, BUN 15, creatinine 1.2, potassium 3.7, AST 44, ALT 40, cholesterol 146, triglycerides 197, HDL 30, LDL 82, hemoglobin A1c 5.9%  Other Studies Reviewed Today:  No interval cardiac testing for review today.  Assessment and Plan:  1.  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   He has however been fairly bradycardic and reports intermittent episodes of lightheadedness but no syncope.  I reviewed his ECG today.  Plan to reduce Toprol-XL to 25 mg daily for now.  If symptoms worsen can follow-up with a cardiac monitor.  2.  Essential hypertension.  Blood pressure control has been reasonable.  He continues to follow with Dr. Quillian Quince and remains on losartan and chlorthalidone with potassium supplement in addition to Toprol-XL.  Disposition:  Follow up  6 months.  Signed, Alan Swanson, M.D., F.A.C.C.

## 2022-04-04 ENCOUNTER — Ambulatory Visit: Payer: Medicare Other | Attending: Cardiology | Admitting: Cardiology

## 2022-04-04 ENCOUNTER — Encounter: Payer: Self-pay | Admitting: Cardiology

## 2022-04-04 VITALS — BP 131/69 | HR 47 | Ht 69.5 in | Wt 285.0 lb

## 2022-04-04 DIAGNOSIS — I10 Essential (primary) hypertension: Secondary | ICD-10-CM | POA: Diagnosis not present

## 2022-04-04 DIAGNOSIS — I471 Supraventricular tachycardia, unspecified: Secondary | ICD-10-CM

## 2022-04-04 MED ORDER — METOPROLOL SUCCINATE ER 25 MG PO TB24: 25.0000 mg | ORAL_TABLET | Freq: Every day | ORAL | 3 refills | Status: DC

## 2022-04-04 NOTE — Patient Instructions (Signed)
Medication Instructions:  DCREASE Toprol XL to 25 mg daily  Labwork: None today  Testing/Procedures: None today  Follow-Up: 6 months  Any Other Special Instructions Will Be Listed Below (If Applicable).  If you need a refill on your cardiac medications before your next appointment, please call your pharmacy.

## 2022-04-11 DIAGNOSIS — M5136 Other intervertebral disc degeneration, lumbar region: Secondary | ICD-10-CM | POA: Diagnosis not present

## 2022-04-26 DIAGNOSIS — M9901 Segmental and somatic dysfunction of cervical region: Secondary | ICD-10-CM | POA: Diagnosis not present

## 2022-04-26 DIAGNOSIS — M9903 Segmental and somatic dysfunction of lumbar region: Secondary | ICD-10-CM | POA: Diagnosis not present

## 2022-04-26 DIAGNOSIS — M9902 Segmental and somatic dysfunction of thoracic region: Secondary | ICD-10-CM | POA: Diagnosis not present

## 2022-04-26 DIAGNOSIS — S233XXA Sprain of ligaments of thoracic spine, initial encounter: Secondary | ICD-10-CM | POA: Diagnosis not present

## 2022-04-26 DIAGNOSIS — S335XXA Sprain of ligaments of lumbar spine, initial encounter: Secondary | ICD-10-CM | POA: Diagnosis not present

## 2022-04-26 DIAGNOSIS — S134XXA Sprain of ligaments of cervical spine, initial encounter: Secondary | ICD-10-CM | POA: Diagnosis not present

## 2022-04-30 DIAGNOSIS — Z1329 Encounter for screening for other suspected endocrine disorder: Secondary | ICD-10-CM | POA: Diagnosis not present

## 2022-04-30 DIAGNOSIS — R739 Hyperglycemia, unspecified: Secondary | ICD-10-CM | POA: Diagnosis not present

## 2022-04-30 DIAGNOSIS — E7849 Other hyperlipidemia: Secondary | ICD-10-CM | POA: Diagnosis not present

## 2022-04-30 DIAGNOSIS — I10 Essential (primary) hypertension: Secondary | ICD-10-CM | POA: Diagnosis not present

## 2022-04-30 DIAGNOSIS — K2100 Gastro-esophageal reflux disease with esophagitis, without bleeding: Secondary | ICD-10-CM | POA: Diagnosis not present

## 2022-05-01 DIAGNOSIS — S335XXA Sprain of ligaments of lumbar spine, initial encounter: Secondary | ICD-10-CM | POA: Diagnosis not present

## 2022-05-01 DIAGNOSIS — Z1329 Encounter for screening for other suspected endocrine disorder: Secondary | ICD-10-CM | POA: Diagnosis not present

## 2022-05-01 DIAGNOSIS — M9903 Segmental and somatic dysfunction of lumbar region: Secondary | ICD-10-CM | POA: Diagnosis not present

## 2022-05-01 DIAGNOSIS — S233XXA Sprain of ligaments of thoracic spine, initial encounter: Secondary | ICD-10-CM | POA: Diagnosis not present

## 2022-05-01 DIAGNOSIS — I10 Essential (primary) hypertension: Secondary | ICD-10-CM | POA: Diagnosis not present

## 2022-05-01 DIAGNOSIS — S134XXA Sprain of ligaments of cervical spine, initial encounter: Secondary | ICD-10-CM | POA: Diagnosis not present

## 2022-05-01 DIAGNOSIS — K2100 Gastro-esophageal reflux disease with esophagitis, without bleeding: Secondary | ICD-10-CM | POA: Diagnosis not present

## 2022-05-01 DIAGNOSIS — M9902 Segmental and somatic dysfunction of thoracic region: Secondary | ICD-10-CM | POA: Diagnosis not present

## 2022-05-01 DIAGNOSIS — E782 Mixed hyperlipidemia: Secondary | ICD-10-CM | POA: Diagnosis not present

## 2022-05-01 DIAGNOSIS — M9901 Segmental and somatic dysfunction of cervical region: Secondary | ICD-10-CM | POA: Diagnosis not present

## 2022-05-03 DIAGNOSIS — H9190 Unspecified hearing loss, unspecified ear: Secondary | ICD-10-CM | POA: Diagnosis not present

## 2022-05-03 DIAGNOSIS — R7301 Impaired fasting glucose: Secondary | ICD-10-CM | POA: Diagnosis not present

## 2022-05-03 DIAGNOSIS — K2100 Gastro-esophageal reflux disease with esophagitis, without bleeding: Secondary | ICD-10-CM | POA: Diagnosis not present

## 2022-05-03 DIAGNOSIS — E7849 Other hyperlipidemia: Secondary | ICD-10-CM | POA: Diagnosis not present

## 2022-05-03 DIAGNOSIS — J301 Allergic rhinitis due to pollen: Secondary | ICD-10-CM | POA: Diagnosis not present

## 2022-05-03 DIAGNOSIS — I10 Essential (primary) hypertension: Secondary | ICD-10-CM | POA: Diagnosis not present

## 2022-05-03 DIAGNOSIS — G4733 Obstructive sleep apnea (adult) (pediatric): Secondary | ICD-10-CM | POA: Diagnosis not present

## 2022-05-03 DIAGNOSIS — Z0001 Encounter for general adult medical examination with abnormal findings: Secondary | ICD-10-CM | POA: Diagnosis not present

## 2022-05-07 DIAGNOSIS — M9902 Segmental and somatic dysfunction of thoracic region: Secondary | ICD-10-CM | POA: Diagnosis not present

## 2022-05-07 DIAGNOSIS — M9903 Segmental and somatic dysfunction of lumbar region: Secondary | ICD-10-CM | POA: Diagnosis not present

## 2022-05-07 DIAGNOSIS — S335XXA Sprain of ligaments of lumbar spine, initial encounter: Secondary | ICD-10-CM | POA: Diagnosis not present

## 2022-05-07 DIAGNOSIS — S134XXA Sprain of ligaments of cervical spine, initial encounter: Secondary | ICD-10-CM | POA: Diagnosis not present

## 2022-05-07 DIAGNOSIS — S233XXA Sprain of ligaments of thoracic spine, initial encounter: Secondary | ICD-10-CM | POA: Diagnosis not present

## 2022-05-07 DIAGNOSIS — M9901 Segmental and somatic dysfunction of cervical region: Secondary | ICD-10-CM | POA: Diagnosis not present

## 2022-05-15 DIAGNOSIS — L299 Pruritus, unspecified: Secondary | ICD-10-CM | POA: Diagnosis not present

## 2022-05-15 DIAGNOSIS — R21 Rash and other nonspecific skin eruption: Secondary | ICD-10-CM | POA: Diagnosis not present

## 2022-05-17 DIAGNOSIS — R42 Dizziness and giddiness: Secondary | ICD-10-CM | POA: Diagnosis not present

## 2022-05-17 DIAGNOSIS — R7301 Impaired fasting glucose: Secondary | ICD-10-CM | POA: Diagnosis not present

## 2022-06-14 DIAGNOSIS — J019 Acute sinusitis, unspecified: Secondary | ICD-10-CM | POA: Diagnosis not present

## 2022-08-29 DIAGNOSIS — M5416 Radiculopathy, lumbar region: Secondary | ICD-10-CM | POA: Diagnosis not present

## 2022-08-29 DIAGNOSIS — G4733 Obstructive sleep apnea (adult) (pediatric): Secondary | ICD-10-CM | POA: Diagnosis not present

## 2022-09-13 DIAGNOSIS — M9903 Segmental and somatic dysfunction of lumbar region: Secondary | ICD-10-CM | POA: Diagnosis not present

## 2022-09-13 DIAGNOSIS — S233XXA Sprain of ligaments of thoracic spine, initial encounter: Secondary | ICD-10-CM | POA: Diagnosis not present

## 2022-09-13 DIAGNOSIS — M9902 Segmental and somatic dysfunction of thoracic region: Secondary | ICD-10-CM | POA: Diagnosis not present

## 2022-09-13 DIAGNOSIS — M9901 Segmental and somatic dysfunction of cervical region: Secondary | ICD-10-CM | POA: Diagnosis not present

## 2022-09-13 DIAGNOSIS — S335XXA Sprain of ligaments of lumbar spine, initial encounter: Secondary | ICD-10-CM | POA: Diagnosis not present

## 2022-09-13 DIAGNOSIS — S134XXA Sprain of ligaments of cervical spine, initial encounter: Secondary | ICD-10-CM | POA: Diagnosis not present

## 2022-09-18 DIAGNOSIS — M9902 Segmental and somatic dysfunction of thoracic region: Secondary | ICD-10-CM | POA: Diagnosis not present

## 2022-09-18 DIAGNOSIS — S134XXA Sprain of ligaments of cervical spine, initial encounter: Secondary | ICD-10-CM | POA: Diagnosis not present

## 2022-09-18 DIAGNOSIS — M9901 Segmental and somatic dysfunction of cervical region: Secondary | ICD-10-CM | POA: Diagnosis not present

## 2022-09-18 DIAGNOSIS — S335XXA Sprain of ligaments of lumbar spine, initial encounter: Secondary | ICD-10-CM | POA: Diagnosis not present

## 2022-09-18 DIAGNOSIS — M9903 Segmental and somatic dysfunction of lumbar region: Secondary | ICD-10-CM | POA: Diagnosis not present

## 2022-09-18 DIAGNOSIS — S233XXA Sprain of ligaments of thoracic spine, initial encounter: Secondary | ICD-10-CM | POA: Diagnosis not present

## 2022-09-21 DIAGNOSIS — M9903 Segmental and somatic dysfunction of lumbar region: Secondary | ICD-10-CM | POA: Diagnosis not present

## 2022-09-21 DIAGNOSIS — S233XXA Sprain of ligaments of thoracic spine, initial encounter: Secondary | ICD-10-CM | POA: Diagnosis not present

## 2022-09-21 DIAGNOSIS — M9902 Segmental and somatic dysfunction of thoracic region: Secondary | ICD-10-CM | POA: Diagnosis not present

## 2022-09-21 DIAGNOSIS — M9901 Segmental and somatic dysfunction of cervical region: Secondary | ICD-10-CM | POA: Diagnosis not present

## 2022-09-21 DIAGNOSIS — S134XXA Sprain of ligaments of cervical spine, initial encounter: Secondary | ICD-10-CM | POA: Diagnosis not present

## 2022-09-21 DIAGNOSIS — S335XXA Sprain of ligaments of lumbar spine, initial encounter: Secondary | ICD-10-CM | POA: Diagnosis not present

## 2022-09-25 DIAGNOSIS — M9901 Segmental and somatic dysfunction of cervical region: Secondary | ICD-10-CM | POA: Diagnosis not present

## 2022-09-25 DIAGNOSIS — M9903 Segmental and somatic dysfunction of lumbar region: Secondary | ICD-10-CM | POA: Diagnosis not present

## 2022-09-25 DIAGNOSIS — S233XXA Sprain of ligaments of thoracic spine, initial encounter: Secondary | ICD-10-CM | POA: Diagnosis not present

## 2022-09-25 DIAGNOSIS — M9902 Segmental and somatic dysfunction of thoracic region: Secondary | ICD-10-CM | POA: Diagnosis not present

## 2022-09-25 DIAGNOSIS — S134XXA Sprain of ligaments of cervical spine, initial encounter: Secondary | ICD-10-CM | POA: Diagnosis not present

## 2022-09-28 DIAGNOSIS — S233XXA Sprain of ligaments of thoracic spine, initial encounter: Secondary | ICD-10-CM | POA: Diagnosis not present

## 2022-09-28 DIAGNOSIS — S134XXA Sprain of ligaments of cervical spine, initial encounter: Secondary | ICD-10-CM | POA: Diagnosis not present

## 2022-09-28 DIAGNOSIS — M9901 Segmental and somatic dysfunction of cervical region: Secondary | ICD-10-CM | POA: Diagnosis not present

## 2022-09-28 DIAGNOSIS — S335XXA Sprain of ligaments of lumbar spine, initial encounter: Secondary | ICD-10-CM | POA: Diagnosis not present

## 2022-09-28 DIAGNOSIS — M9902 Segmental and somatic dysfunction of thoracic region: Secondary | ICD-10-CM | POA: Diagnosis not present

## 2022-09-28 DIAGNOSIS — M9903 Segmental and somatic dysfunction of lumbar region: Secondary | ICD-10-CM | POA: Diagnosis not present

## 2022-10-03 DIAGNOSIS — S335XXA Sprain of ligaments of lumbar spine, initial encounter: Secondary | ICD-10-CM | POA: Diagnosis not present

## 2022-10-03 DIAGNOSIS — M9902 Segmental and somatic dysfunction of thoracic region: Secondary | ICD-10-CM | POA: Diagnosis not present

## 2022-10-03 DIAGNOSIS — M9903 Segmental and somatic dysfunction of lumbar region: Secondary | ICD-10-CM | POA: Diagnosis not present

## 2022-10-03 DIAGNOSIS — M9901 Segmental and somatic dysfunction of cervical region: Secondary | ICD-10-CM | POA: Diagnosis not present

## 2022-10-03 DIAGNOSIS — S134XXA Sprain of ligaments of cervical spine, initial encounter: Secondary | ICD-10-CM | POA: Diagnosis not present

## 2022-10-03 DIAGNOSIS — S233XXA Sprain of ligaments of thoracic spine, initial encounter: Secondary | ICD-10-CM | POA: Diagnosis not present

## 2022-10-05 DIAGNOSIS — M9901 Segmental and somatic dysfunction of cervical region: Secondary | ICD-10-CM | POA: Diagnosis not present

## 2022-10-05 DIAGNOSIS — M9903 Segmental and somatic dysfunction of lumbar region: Secondary | ICD-10-CM | POA: Diagnosis not present

## 2022-10-05 DIAGNOSIS — S134XXA Sprain of ligaments of cervical spine, initial encounter: Secondary | ICD-10-CM | POA: Diagnosis not present

## 2022-10-05 DIAGNOSIS — S233XXA Sprain of ligaments of thoracic spine, initial encounter: Secondary | ICD-10-CM | POA: Diagnosis not present

## 2022-10-05 DIAGNOSIS — M9902 Segmental and somatic dysfunction of thoracic region: Secondary | ICD-10-CM | POA: Diagnosis not present

## 2022-10-11 DIAGNOSIS — R059 Cough, unspecified: Secondary | ICD-10-CM | POA: Diagnosis not present

## 2022-10-11 DIAGNOSIS — J329 Chronic sinusitis, unspecified: Secondary | ICD-10-CM | POA: Diagnosis not present

## 2022-10-11 DIAGNOSIS — R030 Elevated blood-pressure reading, without diagnosis of hypertension: Secondary | ICD-10-CM | POA: Diagnosis not present

## 2022-10-16 ENCOUNTER — Encounter: Payer: Self-pay | Admitting: Cardiology

## 2022-10-16 ENCOUNTER — Ambulatory Visit: Payer: Medicare Other | Attending: Cardiology | Admitting: Cardiology

## 2022-10-16 VITALS — BP 130/62 | HR 56 | Ht 70.0 in | Wt 286.0 lb

## 2022-10-16 DIAGNOSIS — I471 Supraventricular tachycardia, unspecified: Secondary | ICD-10-CM | POA: Diagnosis not present

## 2022-10-16 DIAGNOSIS — I10 Essential (primary) hypertension: Secondary | ICD-10-CM

## 2022-10-16 NOTE — Progress Notes (Signed)
Cardiology Office Note  Date: 10/16/2022   ID: Alan Swanson, DOB 02-Jan-1956, MRN [id number]  History of Present Illness: Alan Swanson is a 66 y.o. male last seen in March.  He is here for a routine visit.  He does not report any significant palpitations in the interim, no increasing shortness of breath, no dizziness or syncope.  He continues to go to the gym a few days a week and is trying to lose weight although this has been difficult.  He is thinking about seeing a weight loss specialist.  We did reduce his Toprol-XL to 25 mg daily at the last visit, he has tolerated this well and heart rate has come up some.  Physical Exam: VS:  BP 130/62 (BP Location: Right Arm)   Pulse (!) 56   Ht 5\' 10"  (1.778 m)   Wt 286 lb (129.7 kg)   SpO2 98%   BMI 41.04 kg/m , BMI Body mass index is 41.04 kg/m.  Wt Readings from Last 3 Encounters:  10/16/22 286 lb (129.7 kg)  04/04/22 285 lb (129.3 kg)  12/13/20 285 lb (129.3 kg)    General: Patient appears comfortable at rest. HEENT: Conjunctiva and lids normal. Neck: Supple, no elevated JVP or carotid bruits. Lungs: Clear to auscultation, nonlabored breathing at rest. Cardiac: Regular rate and rhythm, no S3 or significant systolic murmur.  ECG:  An ECG dated 04/04/2022 was personally reviewed today and demonstrated:  Sinus bradycardia with right bundle branch block.  Labwork:  April 2024: BUN 16, creatinine 1.41, potassium 3.8, AST 39, ALT 34, cholesterol 157, triglycerides 199, HDL 34, LDL 89  Other Studies Reviewed Today:  No interval cardiac testing for review today.  Assessment and Plan:  1.  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  Continue observation.   2.  Essential hypertension.  Continue Cozaar and chlorthalidone with potassium supplement.  He continues to follow with Dr. Reuel Boom.  Disposition:  Follow up  1 year.  Signed, Jonelle Sidle, M.D.,  F.A.C.C. [facility]

## 2022-10-16 NOTE — Patient Instructions (Addendum)

## 2022-10-24 DIAGNOSIS — M51369 Other intervertebral disc degeneration, lumbar region without mention of lumbar back pain or lower extremity pain: Secondary | ICD-10-CM | POA: Diagnosis not present

## 2022-10-30 DIAGNOSIS — E7849 Other hyperlipidemia: Secondary | ICD-10-CM | POA: Diagnosis not present

## 2022-10-30 DIAGNOSIS — R7301 Impaired fasting glucose: Secondary | ICD-10-CM | POA: Diagnosis not present

## 2022-10-30 DIAGNOSIS — I10 Essential (primary) hypertension: Secondary | ICD-10-CM | POA: Diagnosis not present

## 2022-10-30 DIAGNOSIS — K219 Gastro-esophageal reflux disease without esophagitis: Secondary | ICD-10-CM | POA: Diagnosis not present

## 2022-11-06 DIAGNOSIS — H9190 Unspecified hearing loss, unspecified ear: Secondary | ICD-10-CM | POA: Diagnosis not present

## 2022-11-06 DIAGNOSIS — K2100 Gastro-esophageal reflux disease with esophagitis, without bleeding: Secondary | ICD-10-CM | POA: Diagnosis not present

## 2022-11-06 DIAGNOSIS — M51369 Other intervertebral disc degeneration, lumbar region without mention of lumbar back pain or lower extremity pain: Secondary | ICD-10-CM | POA: Diagnosis not present

## 2022-11-06 DIAGNOSIS — N1831 Chronic kidney disease, stage 3a: Secondary | ICD-10-CM | POA: Diagnosis not present

## 2022-11-06 DIAGNOSIS — E7849 Other hyperlipidemia: Secondary | ICD-10-CM | POA: Diagnosis not present

## 2022-11-06 DIAGNOSIS — R7301 Impaired fasting glucose: Secondary | ICD-10-CM | POA: Diagnosis not present

## 2022-11-06 DIAGNOSIS — G4733 Obstructive sleep apnea (adult) (pediatric): Secondary | ICD-10-CM | POA: Diagnosis not present

## 2022-11-06 DIAGNOSIS — J301 Allergic rhinitis due to pollen: Secondary | ICD-10-CM | POA: Diagnosis not present

## 2022-11-06 DIAGNOSIS — I10 Essential (primary) hypertension: Secondary | ICD-10-CM | POA: Diagnosis not present

## 2022-11-06 DIAGNOSIS — Z23 Encounter for immunization: Secondary | ICD-10-CM | POA: Diagnosis not present

## 2022-11-13 DIAGNOSIS — N1831 Chronic kidney disease, stage 3a: Secondary | ICD-10-CM | POA: Diagnosis not present

## 2023-01-25 DIAGNOSIS — M545 Low back pain, unspecified: Secondary | ICD-10-CM | POA: Diagnosis not present

## 2023-01-25 DIAGNOSIS — R059 Cough, unspecified: Secondary | ICD-10-CM | POA: Diagnosis not present

## 2023-01-25 DIAGNOSIS — R030 Elevated blood-pressure reading, without diagnosis of hypertension: Secondary | ICD-10-CM | POA: Diagnosis not present

## 2023-02-05 ENCOUNTER — Telehealth: Payer: Self-pay | Admitting: Cardiology

## 2023-02-05 NOTE — Telephone Encounter (Signed)
   Pre-operative Risk Assessment    Patient Name: Alan Swanson  DOB: April 12, 1955 MRN: [id number]{     Request for Surgical Clearance    Procedure:   LT Total Knee Replacement   Date of Surgery:  Clearance TBD                               Surgeon:  Toribio Higashi, MD Surgeon's Group or Practice Name:  [facility]  Phone number:  [phone] Fax number:  [phone]   Type of Clearance Requested:   - Medical    Type of Anesthesia:  Spinal   Additional requests/questions:  Please fax a copy of Clearance  to the surgeon's office.  Signed, Darryle GORMAN Glance   02/05/2023, 4:37 PM

## 2023-02-06 ENCOUNTER — Telehealth: Payer: Self-pay

## 2023-02-06 DIAGNOSIS — L400 Psoriasis vulgaris: Secondary | ICD-10-CM | POA: Diagnosis not present

## 2023-02-06 DIAGNOSIS — L821 Other seborrheic keratosis: Secondary | ICD-10-CM | POA: Diagnosis not present

## 2023-02-06 DIAGNOSIS — D485 Neoplasm of uncertain behavior of skin: Secondary | ICD-10-CM | POA: Diagnosis not present

## 2023-02-06 NOTE — Telephone Encounter (Signed)
Patient is scheduled for telephone visit.

## 2023-02-06 NOTE — Telephone Encounter (Signed)
 Patient has been scheduled for telephone visit med rec and consent done     Patient Consent for Virtual Visit         [name] has provided verbal consent on 02/06/2023 for a virtual visit (video or telephone).   CONSENT FOR VIRTUAL VISIT FOR:  Alan Swanson  By participating in this virtual visit I agree to the following:  I hereby voluntarily request, consent and authorize [facility] and its employed or contracted physicians, physician assistants, nurse practitioners or other licensed health care professionals (the Practitioner), to provide me with telemedicine health care services (the "Services") as deemed necessary by the treating Practitioner. I acknowledge and consent to receive the Services by the Practitioner via telemedicine. I understand that the telemedicine visit will involve communicating with the Practitioner through live audiovisual communication technology and the disclosure of certain medical information by electronic transmission. I acknowledge that I have been given the opportunity to request an in-person assessment or other available alternative prior to the telemedicine visit and am voluntarily participating in the telemedicine visit.  I understand that I have the right to withhold or withdraw my consent to the use of telemedicine in the course of my care at any time, without affecting my right to future care or treatment, and that the Practitioner or I may terminate the telemedicine visit at any time. I understand that I have the right to inspect all information obtained and/or recorded in the course of the telemedicine visit and may receive copies of available information for a reasonable fee.  I understand that some of the potential risks of receiving the Services via telemedicine include:  Delay or interruption in medical evaluation due to technological equipment failure or disruption; Information transmitted may not be sufficient (e.g. poor resolution  of images) to allow for appropriate medical decision making by the Practitioner; and/or  In rare instances, security protocols could fail, causing a breach of personal health information.  Furthermore, I acknowledge that it is my responsibility to provide information about my medical history, conditions and care that is complete and accurate to the best of my ability. I acknowledge that Practitioner's advice, recommendations, and/or decision may be based on factors not within their control, such as incomplete or inaccurate data provided by me or distortions of diagnostic images or specimens that may result from electronic transmissions. I understand that the practice of medicine is not an exact science and that Practitioner makes no warranties or guarantees regarding treatment outcomes. I acknowledge that a copy of this consent can be made available to me via my patient portal [facility] MyChart), or I can request a printed copy by calling the office of [facility].    I understand that my insurance will be billed for this visit.   I have read or had this consent read to me. I understand the contents of this consent, which adequately explains the benefits and risks of the Services being provided via telemedicine.  I have been provided ample opportunity to ask questions regarding this consent and the Services and have had my questions answered to my satisfaction. I give my informed consent for the services to be provided through the use of telemedicine in my medical care

## 2023-02-06 NOTE — Telephone Encounter (Signed)
   Name: Alan Swanson  DOB: [birth year]  MRN: [id number]  Primary Cardiologist: Teddie Favre, MD Last seen by Dr. Londa Rival on 10/16/2022   Preoperative team, please contact this patient and set up a phone call appointment for further preoperative risk assessment. Please obtain consent and complete medication review. Thank you for your help.  I confirm that guidance regarding antiplatelet and oral anticoagulation therapy has been completed and, if necessary, noted below.  On review of medication list the patient is not on anticoagulation or antiplatelet therapy.  I also confirmed the patient resides in the state of [place] . As per [facility] Medical Board telemedicine laws, the patient must reside in the state in which the provider is licensed.   Friddie Jetty, NP 02/06/2023, 10:31 AM [facility]

## 2023-02-13 DIAGNOSIS — M51362 Other intervertebral disc degeneration, lumbar region with discogenic back pain and lower extremity pain: Secondary | ICD-10-CM | POA: Diagnosis not present

## 2023-02-21 ENCOUNTER — Ambulatory Visit: Payer: Medicare Other | Attending: Cardiology

## 2023-02-21 DIAGNOSIS — Z01810 Encounter for preprocedural cardiovascular examination: Secondary | ICD-10-CM | POA: Diagnosis not present

## 2023-02-21 NOTE — Progress Notes (Signed)
Virtual Visit via Telephone Note   Because of Alan Swanson's co-morbid illnesses, he is at least at moderate risk for complications without adequate follow up.  This format is felt to be most appropriate for this patient at this time.  The patient did not have access to video technology/had technical difficulties with video requiring transitioning to audio format only (telephone).  All issues noted in this document were discussed and addressed.  No physical exam could be performed with this format.  Please refer to the patient's chart for his consent to telehealth for [facility].  Evaluation Performed:  Preoperative cardiovascular risk assessment _____________   Date:  02/21/2023   Patient ID:  Alan Swanson, DOB [birth year], MRN [id number] Patient Location:  Home Provider location:   Office  Primary Care Provider:  Richardean Chimera, MD Primary Cardiologist:  Nona Dell, MD  Chief Complaint / Patient Profile   68 y.o. y/o male with a h/o PSVT, hypertension who is pending left total knee replacement and presents today for telephonic preoperative cardiovascular risk assessment.  History of Present Illness    Alan Swanson is a 68 y.o. male who presents via audio/video conferencing for a telehealth visit today.  Pt was last seen in cardiology clinic on 10/16/2022 by Dr. Diona Browner.  At that time Alan Swanson was doing well .  The patient is now pending procedure as outlined above. Since his last visit, he remains stable from a cardiac standpoint.  Today he denies chest pain, shortness of breath, lower extremity edema, fatigue, palpitations, melena, hematuria, hemoptysis, diaphoresis, weakness, presyncope, syncope, orthopnea, and PND.   Past Medical History    Past Medical History:  Diagnosis Date   Anxiety disorder    Arthritis    Depression    Elevated CPK    Chronically elevated   Essential hypertension    GERD (gastroesophageal reflux disease)     History of kidney stones    Obesity    Palpitations    PSVT (paroxysmal supraventricular tachycardia)    Adenosine - June 2004   Sleep apnea    CPAP - intermittent   Past Surgical History:  Procedure Laterality Date   LEFT ARM SURGERY     AS A CHILD     RIGHT SHOULDER SURGERY  1997   RECURRENT DISLOCATION AND REPAIR OF ROTATOR CUFF IN 12/2004 BY DR MERRITT   TOTAL KNEE ARTHROPLASTY Right 05/01/2013   Procedure: RIGHT TOTAL KNEE ARTHROPLASTY;  Surgeon: Thera Flake., MD;  Location: [facility] OR;  Service: Orthopedics;  Laterality: Right;    Allergies  Allergies  Allergen Reactions   Ace Inhibitors Other (See Comments)    Cough & angioedema 04/2013.     Home Medications    Prior to Admission medications   Medication Sig Start Date End Date Taking? Authorizing Provider  acetaminophen (TYLENOL) 650 MG CR tablet Take 650 mg by mouth 2 (two) times daily.    [provider]  chlorthalidone (HYGROTON) 25 MG tablet Take 12.5 mg by mouth daily.    [provider]  famotidine (PEPCID) 20 MG tablet Take 20 mg by mouth 3 (three) times daily.     [provider]  fluticasone (FLONASE) 50 MCG/ACT nasal spray Place 2 sprays into both nostrils daily. 09/22/22   [provider]  hydrocortisone 2.5 % cream Apply 1 Application topically daily as needed. 10/08/22   [provider]  loratadine (CLARITIN) 10 MG tablet Take 10 mg by mouth daily.  [provider]  losartan (COZAAR) 100 MG tablet Take 100 mg by mouth daily.    [provider]  methocarbamol (ROBAXIN) 500 MG tablet Take 500 mg by mouth 4 (four) times daily as needed. 08/06/22   [provider]  metoprolol succinate (TOPROL XL) 25 MG 24 hr tablet Take 1 tablet (25 mg total) by mouth daily. 04/04/22   Jonelle Sidle, MD  pantoprazole (PROTONIX) 40 MG tablet Take 40 mg by mouth daily.    [provider]  potassium chloride SA (K-DUR,KLOR-CON) 20 MEQ tablet Take 20  mEq by mouth daily.    [provider]  Semaglutide,0.25 or 0.5MG /DOS, 2 MG/1.5ML SOPN Inject into the skin once a week.    [provider]    Physical Exam    Vital Signs:  Alan Swanson does not have vital signs available for review today.  Given telephonic nature of communication, physical exam is limited. AAOx3. NAD. Normal affect.  Speech and respirations are unlabored.  Accessory Clinical Findings    None  Assessment & Plan    1.  Preoperative Cardiovascular Risk Assessment:Procedure:   LT Total Knee Replacement    Date of Surgery:  Clearance TBD                               Surgeon:  [name], MD Surgeon's Group or Practice Name:  [facility]  Fax number:  [phone]      Primary Cardiologist: Nona Dell, MD  Chart reviewed as part of pre-operative protocol coverage. Given past medical history and time since last visit, based on ACC/AHA guidelines, Alan Swanson would be at acceptable risk for the planned procedure without further cardiovascular testing.   His RCRI is very low risk, 0.4% risk of major cardiac event.  He is able to complete greater than 4 METS of physical activity.  Patient was advised that if he develops new symptoms prior to surgery to contact our office to arrange a follow-up appointment.  He verbalized understanding.  I will route this recommendation to the requesting party via Epic fax function and remove from pre-op pool.       Time:   Today, I have spent 7 minutes with the patient with telehealth technology discussing medical history, symptoms, and management plan.     [name], NP  02/21/2023, 6:59 AM

## 2023-03-20 DIAGNOSIS — Z23 Encounter for immunization: Secondary | ICD-10-CM | POA: Diagnosis not present

## 2023-03-29 ENCOUNTER — Other Ambulatory Visit: Payer: Self-pay | Admitting: Cardiology

## 2023-04-23 ENCOUNTER — Telehealth: Payer: Self-pay | Admitting: *Deleted

## 2023-04-23 NOTE — Telephone Encounter (Signed)
 Clearance notes have been sent to surgeon office.

## 2023-05-22 DIAGNOSIS — Z1329 Encounter for screening for other suspected endocrine disorder: Secondary | ICD-10-CM | POA: Diagnosis not present

## 2023-05-22 DIAGNOSIS — I10 Essential (primary) hypertension: Secondary | ICD-10-CM | POA: Diagnosis not present

## 2023-05-22 DIAGNOSIS — Z131 Encounter for screening for diabetes mellitus: Secondary | ICD-10-CM | POA: Diagnosis not present

## 2023-05-22 DIAGNOSIS — E7849 Other hyperlipidemia: Secondary | ICD-10-CM | POA: Diagnosis not present

## 2023-05-22 DIAGNOSIS — D559 Anemia due to enzyme disorder, unspecified: Secondary | ICD-10-CM | POA: Diagnosis not present

## 2023-05-29 DIAGNOSIS — Z0001 Encounter for general adult medical examination with abnormal findings: Secondary | ICD-10-CM | POA: Diagnosis not present

## 2023-05-29 DIAGNOSIS — E7849 Other hyperlipidemia: Secondary | ICD-10-CM | POA: Diagnosis not present

## 2023-05-29 DIAGNOSIS — I10 Essential (primary) hypertension: Secondary | ICD-10-CM | POA: Diagnosis not present

## 2023-05-29 DIAGNOSIS — Z0000 Encounter for general adult medical examination without abnormal findings: Secondary | ICD-10-CM | POA: Diagnosis not present

## 2023-05-29 DIAGNOSIS — E782 Mixed hyperlipidemia: Secondary | ICD-10-CM | POA: Diagnosis not present

## 2023-05-29 DIAGNOSIS — J301 Allergic rhinitis due to pollen: Secondary | ICD-10-CM | POA: Diagnosis not present

## 2023-05-29 DIAGNOSIS — Z23 Encounter for immunization: Secondary | ICD-10-CM | POA: Diagnosis not present

## 2023-05-29 DIAGNOSIS — Z1389 Encounter for screening for other disorder: Secondary | ICD-10-CM | POA: Diagnosis not present

## 2023-09-03 ENCOUNTER — Telehealth: Payer: Self-pay | Admitting: Cardiology

## 2023-09-03 ENCOUNTER — Telehealth (HOSPITAL_BASED_OUTPATIENT_CLINIC_OR_DEPARTMENT_OTHER): Payer: Self-pay

## 2023-09-03 NOTE — Telephone Encounter (Signed)
   Pre-operative Risk Assessment    Patient Name: Alan Swanson  DOB: 12/08/1955 MRN: [id number]      Request for Surgical Clearance    Procedure:  Lt Total Knee Replacement  Date of Surgery:  Clearance TBD                                 Surgeon:  Toribio Higashi M.D. Surgeon's Group or Practice Name:  [facility] Phone number:  [phone] Fax number:  [phone]   Type of Clearance Requested:   - Medical    Type of Anesthesia:  Spinal   Additional requests/questions:  Please fax a copy of clearance to the surgeon's office. Previous Clearance has expired   Signed, Darryle GORMAN Glance   09/03/2023, 3:09 PM

## 2023-09-03 NOTE — Telephone Encounter (Signed)
  Name: Alan Swanson  DOB: 1955/04/20  MRN: [id number]  Primary Cardiologist: Jayson Sierras, MD Last OV 10/16/22  Preoperative team, please contact this patient and set up a phone call appointment for further preoperative risk assessment. Please obtain consent and complete medication review. Thank you for your help.  I confirm that guidance regarding antiplatelet and oral anticoagulation therapy has been completed and, if necessary, noted below. None requested.   I also confirmed the patient resides in the state of [place] . As per [facility] Medical Board telemedicine laws, the patient must reside in the state in which the provider is licensed.  Ayodele Hartsock D Sehaj Mcenroe, NP 09/03/2023, 3:20 PM [facility]

## 2023-09-03 NOTE — Telephone Encounter (Signed)
  Patient Consent for Virtual Visit        Alan Swanson has provided verbal consent on 09/03/2023 for a virtual visit (video or telephone).   CONSENT FOR VIRTUAL VISIT FOR:  Alan Swanson  By participating in this virtual visit I agree to the following:  I hereby voluntarily request, consent and authorize [facility] and its employed or contracted physicians, physician assistants, nurse practitioners or other licensed health care professionals (the Practitioner), to provide me with telemedicine health care services (the "Services) as deemed necessary by the treating Practitioner. I acknowledge and consent to receive the Services by the Practitioner via telemedicine. I understand that the telemedicine visit will involve communicating with the Practitioner through live audiovisual communication technology and the disclosure of certain medical information by electronic transmission. I acknowledge that I have been given the opportunity to request an in-person assessment or other available alternative prior to the telemedicine visit and am voluntarily participating in the telemedicine visit.  I understand that I have the right to withhold or withdraw my consent to the use of telemedicine in the course of my care at any time, without affecting my right to future care or treatment, and that the Practitioner or I may terminate the telemedicine visit at any time. I understand that I have the right to inspect all information obtained and/or recorded in the course of the telemedicine visit and may receive copies of available information for a reasonable fee.  I understand that some of the potential risks of receiving the Services via telemedicine include:  Delay or interruption in medical evaluation due to technological equipment failure or disruption; Information transmitted may not be sufficient (e.g. poor resolution of images) to allow for appropriate medical decision making by the  Practitioner; and/or  In rare instances, security protocols could fail, causing a breach of personal health information.  Furthermore, I acknowledge that it is my responsibility to provide information about my medical history, conditions and care that is complete and accurate to the best of my ability. I acknowledge that Practitioner's advice, recommendations, and/or decision may be based on factors not within their control, such as incomplete or inaccurate data provided by me or distortions of diagnostic images or specimens that may result from electronic transmissions. I understand that the practice of medicine is not an exact science and that Practitioner makes no warranties or guarantees regarding treatment outcomes. I acknowledge that a copy of this consent can be made available to me via my patient portal [facility] MyChart), or I can request a printed copy by calling the office of [facility].    I understand that my insurance will be billed for this visit.   I have read or had this consent read to me. I understand the contents of this consent, which adequately explains the benefits and risks of the Services being provided via telemedicine.  I have been provided ample opportunity to ask questions regarding this consent and the Services and have had my questions answered to my satisfaction. I give my informed consent for the services to be provided through the use of telemedicine in my medical care

## 2023-09-03 NOTE — Telephone Encounter (Signed)
 Preop tele appt now scheduled, med rec and consent done.

## 2023-09-09 DIAGNOSIS — G4733 Obstructive sleep apnea (adult) (pediatric): Secondary | ICD-10-CM | POA: Diagnosis not present

## 2023-09-12 ENCOUNTER — Ambulatory Visit: Attending: Cardiology | Admitting: Nurse Practitioner

## 2023-09-12 ENCOUNTER — Telehealth: Payer: Self-pay | Admitting: Nurse Practitioner

## 2023-09-12 ENCOUNTER — Encounter: Payer: Self-pay | Admitting: Nurse Practitioner

## 2023-09-12 DIAGNOSIS — Z01810 Encounter for preprocedural cardiovascular examination: Secondary | ICD-10-CM | POA: Diagnosis not present

## 2023-09-12 NOTE — Telephone Encounter (Signed)
 Pt calling to say that he missed his pre op telephone appt due to having two other appts today and would like a c/b asap. Please advise.

## 2023-09-12 NOTE — Progress Notes (Signed)
 Sent message, via epic in basket, requesting orders in epic from Careers adviser.

## 2023-09-12 NOTE — Telephone Encounter (Signed)
 Called patient and completed preoperative cardiac evaluation.  Rosaline EMERSON Bane, NP-C  09/12/2023, 4:48 PM [address] [place], [place] [address] Office [phone] Fax [phone]

## 2023-09-12 NOTE — Progress Notes (Signed)
 Virtual Visit via Telephone Note   Because of Mattheu Brodersen co-morbid illnesses, he is at least at moderate risk for complications without adequate follow up.  This format is felt to be most appropriate for this patient at this time.  Due to technical limitations with video connection (technology), today's appointment will be conducted as an audio only telehealth visit, and Rodrigo Mcgranahan verbally agreed to proceed in this manner.   All issues noted in this document were discussed and addressed.  No physical exam could be performed with this format.  Evaluation Performed:  Preoperative cardiovascular risk assessment _____________   Date:  09/12/2023   Patient ID:  Alan Swanson, DOB 1955/06/07, MRN [id number] Patient Location:  Home Provider location:   Office  Primary Care Provider:  Toribio Jerel MATSU, MD Primary Cardiologist:  Jayson Sierras, MD  Chief Complaint / Patient Profile   68 y.o. y/o male with a h/o PSVT, hypertension, obesity, sleep apnea who is pending left total knee replacement with Dr. Edna on date TBD and presents today for telephonic preoperative cardiovascular risk assessment.  History of Present Illness    Alan Swanson is a 68 y.o. male who presents via audio/video conferencing for a telehealth visit today.  Pt was last seen in cardiology clinic on 10/16/22 by Dr. Sierras.  At that time Treyshaun Keatts was doing well.  The patient is now pending procedure as outlined above. Since his last visit, he  denies chest pain, shortness of breath, lower extremity edema, fatigue, palpitations, melena, hematuria, hemoptysis, diaphoresis, weakness, presyncope, syncope, orthopnea, and PND. He is somewhat limited by knee pain but is able to achieve > 4 METS activity without concerning cardiac symptoms.   Past Medical History    Past Medical History:  Diagnosis Date   Anxiety disorder    Arthritis    Depression    Elevated CPK    Chronically elevated    Essential hypertension    GERD (gastroesophageal reflux disease)    History of kidney stones    Obesity    Palpitations    PSVT (paroxysmal supraventricular tachycardia) (HCC)    Adenosine - June 2004   Sleep apnea    CPAP - intermittent   Past Surgical History:  Procedure Laterality Date   LEFT ARM SURGERY     AS A CHILD     RIGHT SHOULDER SURGERY  1997   RECURRENT DISLOCATION AND REPAIR OF ROTATOR CUFF IN 12/2004 BY DR MERRITT   TOTAL KNEE ARTHROPLASTY Right 05/01/2013   Procedure: RIGHT TOTAL KNEE ARTHROPLASTY;  Surgeon: LELON JONETTA Shari Mickey., MD;  Location: [facility] OR;  Service: Orthopedics;  Laterality: Right;    Allergies  Allergies  Allergen Reactions   Ace Inhibitors Other (See Comments)    Cough & angioedema 04/2013.     Home Medications    Prior to Admission medications   Medication Sig Start Date End Date Taking? Authorizing Provider  acetaminophen  (TYLENOL ) 650 MG CR tablet Take 650 mg by mouth 2 (two) times daily.    [provider]  chlorthalidone (HYGROTON) 25 MG tablet Take 25 mg by mouth daily.    [provider]  famotidine (PEPCID) 20 MG tablet Take 20 mg by mouth 3 (three) times daily.     [provider]  fluticasone (FLONASE) 50 MCG/ACT nasal spray Place 2 sprays into both nostrils daily. 09/22/22   [provider]  hydrocortisone 2.5 % cream Apply 1 Application topically daily as needed. 10/08/22  [provider]  loratadine  (CLARITIN ) 10 MG tablet Take 10 mg by mouth daily.    [provider]  losartan (COZAAR) 100 MG tablet Take 100 mg by mouth daily.    [provider]  methocarbamol  (ROBAXIN ) 500 MG tablet Take 500 mg by mouth 4 (four) times daily as needed. 08/06/22   [provider]  metoprolol  succinate (TOPROL -XL) 25 MG 24 hr tablet TAKE ONE TABLET BY MOUTH ONCE DAILY 03/29/23   Debera Jayson MATSU, MD  pantoprazole  (PROTONIX ) 40 MG tablet Take 40 mg by mouth daily.    [provider]  potassium chloride SA (K-DUR,KLOR-CON) 20 MEQ tablet Take 20 mEq by mouth daily.    [provider]    Physical Exam    Vital Signs:  Akio Hudnall does not have vital signs available for review today.  Given telephonic nature of communication, physical exam is limited. AAOx3. NAD. Normal affect.  Speech and respirations are unlabored.  Accessory Clinical Findings    None  Assessment & Plan    1.  Preoperative Cardiovascular Risk Assessment: According to the Revised Cardiac Risk Index (RCRI), his Perioperative Risk of Major Cardiac Event is (%): 0.9 His Functional Capacity in METs is: 6.82 according to the [facility] Activity Status Index (DASI). The patient is doing well from a cardiac perspective. Therefore, based on ACC/AHA guidelines, the patient would be at acceptable risk for the planned procedure without further cardiovascular testing.   The patient was advised that if he develops new symptoms prior to surgery to contact our office to arrange for a follow-up visit, and he verbalized understanding.  No request to hold cardiac medications.  A copy of this note will be routed to requesting surgeon.  Time:   Today, I have spent 10 minutes with the patient with telehealth technology discussing medical history, symptoms, and management plan.     Rosaline EMERSON Bane, NP-C  09/12/2023, 4:46 PM [address] [place], [place] [address] Office [phone] Fax [phone]

## 2023-09-17 DIAGNOSIS — G4733 Obstructive sleep apnea (adult) (pediatric): Secondary | ICD-10-CM | POA: Diagnosis not present

## 2023-09-17 NOTE — Progress Notes (Signed)
 Second request for pre op orders, left voicemail for [employer].

## 2023-09-18 NOTE — Progress Notes (Signed)
 Please place orders for PAT appointment scheduled 09/19/23.

## 2023-09-18 NOTE — Patient Instructions (Signed)
 SURGICAL WAITING ROOM VISITATION  Patients having surgery or a procedure may have no more than 2 support people in the waiting area - these visitors may rotate.    Children under the age of 60 must have an adult with them who is not the patient.  Visitors with respiratory illnesses are discouraged from visiting and should remain at home.  If the patient needs to stay at the hospital during part of their recovery, the visitor guidelines for inpatient rooms apply. Pre-op nurse will coordinate an appropriate time for 1 support person to accompany patient in pre-op.  This support person may not rotate.    Please refer to the [facility] website for the visitor guidelines for Inpatients (after your surgery is over and you are in a regular room).    Your procedure is scheduled on: 10/02/23   Report to [facility] Main Entrance    Report to admitting at 8:30 AM   Call this number if you have problems the morning of surgery [phone]   Do not eat food :After Midnight.   After Midnight you may have the following liquids until 8:00 AM DAY OF SURGERY  Water Non-Citrus Juices (without pulp, NO RED-Apple, White grape, White cranberry) Black Coffee (NO MILK/CREAM OR CREAMERS, sugar ok)  Clear Tea (NO MILK/CREAM OR CREAMERS, sugar ok) regular and decaf                             Plain Jell-O (NO RED)                                           Fruit ices (not with fruit pulp, NO RED)                                     Popsicles (NO RED)                                                               Sports drinks like Gatorade (NO RED)                 The day of surgery:  Drink ONE (1) Pre-Surgery Clear Ensure or G2 at AM the morning of surgery. Drink in one sitting. Do not sip.  This drink was given to you during your hospital  pre-op appointment visit. Nothing else to drink after completing the  Pre-Surgery Clear Ensure or G2.          If you have questions, please contact your  surgeon's office.   FOLLOW BOWEL PREP AND ANY ADDITIONAL PRE OP INSTRUCTIONS YOU RECEIVED FROM YOUR SURGEON'S OFFICE!!!     Oral Hygiene is also important to reduce your risk of infection.                                    Remember - BRUSH YOUR TEETH THE MORNING OF SURGERY WITH YOUR REGULAR TOOTHPASTE  DENTURES WILL BE REMOVED PRIOR TO SURGERY PLEASE DO NOT APPLY  Poly grip OR ADHESIVES!!!   Do NOT smoke after Midnight   Stop all vitamins and herbal supplements 7 days before surgery.   Take these medicines the morning of surgery with A SIP OF WATER: Tylenol , Pepcid, Claritin , Metoprolol , Pantoprazole    DO NOT TAKE ANY ORAL DIABETIC MEDICATIONS DAY OF YOUR SURGERY  Bring CPAP mask and tubing day of surgery.                              You may not have any metal on your body including jewelry, and body piercing             Do not wear lotions, powders, cologne, or deodorant              Men may shave face and neck.   Do not bring valuables to the hospital. [facility] IS NOT             RESPONSIBLE   FOR VALUABLES.   Contacts, glasses, dentures or bridgework may not be worn into surgery.  DO NOT BRING YOUR HOME MEDICATIONS TO THE HOSPITAL. PHARMACY WILL DISPENSE MEDICATIONS LISTED ON YOUR MEDICATION LIST TO YOU DURING YOUR ADMISSION IN THE HOSPITAL!    Patients discharged on the day of surgery will not be allowed to drive home.  Someone NEEDS to stay with you for the first 24 hours after anesthesia.   Special Instructions: Bring a copy of your healthcare power of attorney and living will documents the day of surgery if you haven't scanned them before.              Please read over the following fact sheets you were given: IF YOU HAVE QUESTIONS ABOUT YOUR PRE-OP INSTRUCTIONS PLEASE CALL [phone] [place].   If you received a COVID test during your pre-op visit  it is requested that you wear a mask when out in public, stay away from anyone that may not be feeling well and  notify your surgeon if you develop symptoms. If you test positive for Covid or have been in contact with anyone that has tested positive in the last 10 days please notify you surgeon.      Pre-operative 5 CHG Bath Instructions   You can play a key role in reducing the risk of infection after surgery. Your skin needs to be as free of germs as possible. You can reduce the number of germs on your skin by washing with CHG (chlorhexidine  gluconate) soap before surgery. CHG is an antiseptic soap that kills germs and continues to kill germs even after washing.   DO NOT use if you have an allergy to chlorhexidine /CHG or antibacterial soaps. If your skin becomes reddened or irritated, stop using the CHG and notify one of our RNs at [phone].   Please shower with the CHG soap starting 4 days before surgery using the following schedule:     Please keep in mind the following:  DO NOT shave, including legs and underarms, starting the day of your first shower.   You may shave your face at any point before/day of surgery.  Place clean sheets on your bed the day you start using CHG soap. Use a clean washcloth (not used since being washed) for each shower. DO NOT sleep with pets once you start using the CHG.   CHG Shower Instructions:  If you choose to wash your hair and private area, wash first with your normal shampoo/soap.  After you use shampoo/soap, rinse your hair and body thoroughly to remove shampoo/soap residue.  Turn the water OFF and apply about 3 tablespoons (45 ml) of CHG soap to a CLEAN washcloth.  Apply CHG soap ONLY FROM YOUR NECK DOWN TO YOUR TOES (washing for 3-5 minutes)  DO NOT use CHG soap on face, private areas, open wounds, or sores.  Pay special attention to the area where your surgery is being performed.  If you are having back surgery, having someone wash your back for you may be helpful. Wait 2 minutes after CHG soap is applied, then you may rinse off the CHG soap.  Pat  dry with a clean towel  Put on clean clothes/pajamas   If you choose to wear lotion, please use ONLY the CHG-compatible lotions on the back of this paper.     Additional instructions for the day of surgery: DO NOT APPLY any lotions, deodorants, cologne, or perfumes.   Put on clean/comfortable clothes.  Brush your teeth.  Ask your nurse before applying any prescription medications to the skin.      CHG Compatible Lotions   Aveeno Moisturizing lotion  Cetaphil Moisturizing Cream  Cetaphil Moisturizing Lotion  Clairol Herbal Essence Moisturizing Lotion, Dry Skin  Clairol Herbal Essence Moisturizing Lotion, Extra Dry Skin  Clairol Herbal Essence Moisturizing Lotion, Normal Skin  Curel Age Defying Therapeutic Moisturizing Lotion with Alpha Hydroxy  Curel Extreme Care Body Lotion  Curel Soothing Hands Moisturizing Hand Lotion  Curel Therapeutic Moisturizing Cream, Fragrance-Free  Curel Therapeutic Moisturizing Lotion, Fragrance-Free  Curel Therapeutic Moisturizing Lotion, Original Formula  Eucerin Daily Replenishing Lotion  Eucerin Dry Skin Therapy Plus Alpha Hydroxy Crme  Eucerin Dry Skin Therapy Plus Alpha Hydroxy Lotion  Eucerin Original Crme  Eucerin Original Lotion  Eucerin Plus Crme Eucerin Plus Lotion  Eucerin TriLipid Replenishing Lotion  Keri Anti-Bacterial Hand Lotion  Keri Deep Conditioning Original Lotion Dry Skin Formula Softly Scented  Keri Deep Conditioning Original Lotion, Fragrance Free Sensitive Skin Formula  Keri Lotion Fast Absorbing Fragrance Free Sensitive Skin Formula  Keri Lotion Fast Absorbing Softly Scented Dry Skin Formula  Keri Original Lotion  Keri Skin Renewal Lotion Keri Silky Smooth Lotion  Keri Silky Smooth Sensitive Skin Lotion  Nivea Body Creamy Conditioning Oil  Nivea Body Extra Enriched Teacher, adult education Moisturizing Lotion Nivea Crme  Nivea Skin Firming Lotion  NutraDerm 30 Skin Lotion  NutraDerm  Skin Lotion  NutraDerm Therapeutic Skin Cream  NutraDerm Therapeutic Skin Lotion  ProShield Protective Hand Cream  Provon moisturizing lotion

## 2023-09-18 NOTE — Progress Notes (Signed)
 COVID Vaccine Completed:  Date of COVID positive in last 90 days:  PCP - Jerel Sieving, MD Cardiologist - Jayson Sierras, MD  Cardiac clearance by Rosaline Bane, MP 09/12/23 in Epic   Chest x-ray - n/a EKG - 09/19/23 Epic/chart Stress Test - more than 10 years ago per pt ECHO - N/A Cardiac Cath - n/a Pacemaker/ICD device last checked:N/A Spinal Cord Stimulator:N/A  Bowel Prep - N/A  Sleep Study - yes CPAP - yes every night  Fasting Blood Sugar - N/A Checks Blood Sugar _____ times a day  Last dose of GLP1 agonist-  N/A GLP1 instructions:  Do not take after     Last dose of SGLT-2 inhibitors-  N/A SGLT-2 instructions:  Do not take after     Blood Thinner Instructions: N/A Last dose:   Time: Aspirin Instructions:N/A Last Dose:  Activity level: Can go up a flight of stairs and perform activities of daily living without stopping and without symptoms of chest pain or shortness of breath.  Anesthesia review: HTN, OSA, AVT, CP, bradycardia at PST 44 on EKG and pulse ox  Patient denies shortness of breath, fever, cough and chest pain at PAT appointment  Patient verbalized understanding of instructions that were given to them at the PAT appointment. Patient was also instructed that they will need to review over the PAT instructions again at home before surgery.

## 2023-09-19 ENCOUNTER — Other Ambulatory Visit: Payer: Self-pay

## 2023-09-19 ENCOUNTER — Encounter (HOSPITAL_COMMUNITY): Payer: Self-pay

## 2023-09-19 ENCOUNTER — Telehealth: Payer: Self-pay | Admitting: Cardiology

## 2023-09-19 ENCOUNTER — Ambulatory Visit: Payer: Self-pay | Admitting: Emergency Medicine

## 2023-09-19 ENCOUNTER — Encounter (HOSPITAL_COMMUNITY)
Admission: RE | Admit: 2023-09-19 | Discharge: 2023-09-19 | Disposition: A | Discharge status: Home / Self Care | Payer: Worker's Compensation | Source: Ambulatory Visit | Attending: Orthopedic Surgery | Admitting: Orthopedic Surgery

## 2023-09-19 VITALS — BP 112/65 | HR 44 | Temp 98.2°F | Resp 16 | Ht 69.0 in

## 2023-09-19 DIAGNOSIS — M25562 Pain in left knee: Secondary | ICD-10-CM | POA: Diagnosis not present

## 2023-09-19 DIAGNOSIS — G8929 Other chronic pain: Secondary | ICD-10-CM | POA: Insufficient documentation

## 2023-09-19 DIAGNOSIS — Z01818 Encounter for other preprocedural examination: Secondary | ICD-10-CM | POA: Insufficient documentation

## 2023-09-19 DIAGNOSIS — I10 Essential (primary) hypertension: Secondary | ICD-10-CM | POA: Diagnosis not present

## 2023-09-19 DIAGNOSIS — G4730 Sleep apnea, unspecified: Secondary | ICD-10-CM | POA: Diagnosis not present

## 2023-09-19 DIAGNOSIS — M1712 Unilateral primary osteoarthritis, left knee: Secondary | ICD-10-CM | POA: Insufficient documentation

## 2023-09-19 LAB — CBC WITH DIFFERENTIAL/PLATELET
Abs Immature Granulocytes: 0.05 K/uL (ref 0.00–0.07)
Basophils Absolute: 0.1 K/uL (ref 0.0–0.1)
Basophils Relative: 1 %
Eosinophils Absolute: 0.4 K/uL (ref 0.0–0.5)
Eosinophils Relative: 5 %
HCT: 48.5 % (ref 39.0–52.0)
Hemoglobin: 15 g/dL (ref 13.0–17.0)
Immature Granulocytes: 1 %
Lymphocytes Relative: 23 %
Lymphs Abs: 2 K/uL (ref 0.7–4.0)
MCH: 26.8 pg (ref 26.0–34.0)
MCHC: 30.9 g/dL (ref 30.0–36.0)
MCV: 86.8 fL (ref 80.0–100.0)
Monocytes Absolute: 0.9 K/uL (ref 0.1–1.0)
Monocytes Relative: 11 %
Neutro Abs: 5.1 K/uL (ref 1.7–7.7)
Neutrophils Relative %: 59 %
Platelets: 170 K/uL (ref 150–400)
RBC: 5.59 MIL/uL (ref 4.22–5.81)
RDW: 13.9 % (ref 11.5–15.5)
WBC: 8.5 K/uL (ref 4.0–10.5)
nRBC: 0 % (ref 0.0–0.2)

## 2023-09-19 LAB — COMPREHENSIVE METABOLIC PANEL WITH GFR
ALT: 29 U/L (ref 0–44)
AST: 41 U/L (ref 15–41)
Albumin: 4.3 g/dL (ref 3.5–5.0)
Alkaline Phosphatase: 66 U/L (ref 38–126)
Anion gap: 17 — ABNORMAL HIGH (ref 5–15)
BUN: 16 mg/dL (ref 8–23)
CO2: 22 mmol/L (ref 22–32)
Calcium: 10 mg/dL (ref 8.9–10.3)
Chloride: 102 mmol/L (ref 98–111)
Creatinine, Ser: 1.21 mg/dL (ref 0.61–1.24)
GFR, Estimated: >60 mL/min (ref >60)
Glucose, Bld: 94 mg/dL (ref 70–99)
Potassium: 3.8 mmol/L (ref 3.5–5.1)
Sodium: 141 mmol/L (ref 135–145)
Total Bilirubin: 0.5 mg/dL (ref 0.0–1.2)
Total Protein: 7.2 g/dL (ref 6.5–8.1)

## 2023-09-19 LAB — TYPE AND SCREEN
ABO/RH(D): O POS
Antibody Screen: NEGATIVE

## 2023-09-19 NOTE — Telephone Encounter (Signed)
 Mr. Alan Swanson walked into the office stating for 2 weeks now his heart rate has been running low .  He is scheduled for surgery 10/02/23 Alan Swanson. Was told by the lab at [facility] Latest readings 8-28 @ 1pm  131/57 HR 046 and 8-28 400pm 122/64 HR 062.

## 2023-09-19 NOTE — Telephone Encounter (Signed)
 Patient was told to check with Dr.MCDowell about not taking the metoprolol  succinate (TOPROL -XL) 25 MG 24 hr tablet  before the surgery.

## 2023-09-19 NOTE — H&P (Signed)
 TOTAL KNEE ADMISSION H&P  Patient is being admitted for left total knee arthroplasty.  Subjective:  Chief Complaint:left knee pain.  HPI: Alan Swanson, 68 y.o. male, has a history of pain and functional disability in the left knee due to arthritis and has failed non-surgical conservative treatments for greater than 12 weeks to includeNSAID's and/or analgesics, corticosteriod injections, viscosupplementation injections, use of assistive devices, and activity modification.  Onset of symptoms was gradual, starting 10 years ago with gradually worsening course since that time. The patient noted no past surgery on the left knee(s).  Patient currently rates pain in the left knee(s) at 10 out of 10 with activity. Patient has night pain, worsening of pain with activity and weight bearing, pain that interferes with activities of daily living, and pain with passive range of motion.  Patient has evidence of periarticular osteophytes and joint space narrowing by imaging studies. There is no active infection.  Patient Active Problem List   Diagnosis Date Noted   Osteoarthritis of right knee 05/01/2013   Preop cardiovascular exam 03/24/2013   Ejection fraction    Supraventricular tachycardia (HCC)    Chest pain    Hypertension    GERD (gastroesophageal reflux disease)    Obesity    Elevated CPK    Palpitations    Sleep apnea    Past Medical History:  Diagnosis Date   Anxiety disorder    Arthritis    Depression    Elevated CPK    Chronically elevated   Essential hypertension    GERD (gastroesophageal reflux disease)    History of kidney stones    Obesity    Palpitations    PSVT (paroxysmal supraventricular tachycardia) (HCC)    Adenosine - June 2004   Sleep apnea    CPAP - intermittent    Past Surgical History:  Procedure Laterality Date   LEFT ARM SURGERY     AS A CHILD     RIGHT SHOULDER SURGERY  1997   RECURRENT DISLOCATION AND REPAIR OF ROTATOR CUFF IN 12/2004 BY DR MERRITT    TOTAL KNEE ARTHROPLASTY Right 05/01/2013   Procedure: RIGHT TOTAL KNEE ARTHROPLASTY;  Surgeon: LELON JONETTA Shari Mickey., MD;  Location: [facility] OR;  Service: Orthopedics;  Laterality: Right;    Current Outpatient Medications  Medication Sig Dispense Refill Last Dose/Taking   acetaminophen  (TYLENOL ) 650 MG CR tablet Take 650-1,300 mg by mouth See admin instructions. Take 1300 mg in the morning and 650 mg in the evening      chlorthalidone (HYGROTON) 25 MG tablet Take 25 mg by mouth daily.      famotidine (PEPCID) 20 MG tablet Take 20 mg by mouth 2 (two) times daily.      fluticasone (FLONASE) 50 MCG/ACT nasal spray Place 2 sprays into both nostrils 2 (two) times daily.      loratadine  (CLARITIN ) 10 MG tablet Take 10 mg by mouth daily.      losartan (COZAAR) 100 MG tablet Take 100 mg by mouth daily.      methocarbamol  (ROBAXIN ) 500 MG tablet Take 500 mg by mouth 4 (four) times daily as needed.      metoprolol  succinate (TOPROL -XL) 25 MG 24 hr tablet TAKE ONE TABLET BY MOUTH ONCE DAILY 90 tablet 3    pantoprazole  (PROTONIX ) 40 MG tablet Take 40 mg by mouth daily.      potassium chloride SA (K-DUR,KLOR-CON) 20 MEQ tablet Take 20 mEq by mouth daily.      No current facility-administered medications for  this visit.   Allergies  Allergen Reactions   Ace Inhibitors Other (See Comments)    Cough & angioedema 04/2013.     Social History   Tobacco Use   Smoking status: Never   Smokeless tobacco: Never  Substance Use Topics   Alcohol use: No    Alcohol/week: 0.0 standard drinks of alcohol    Family History  Problem Relation Age of Onset   Aneurysm Mother    Heart disease Mother    COPD Father    Heart disease Other        family h/o   Cancer Other        family h/o     Review of Systems  Musculoskeletal:  Positive for arthralgias.  All other systems reviewed and are negative.   Objective:  Physical Exam Constitutional:      General: He is not in acute distress.    Appearance: Normal  appearance. He is normal weight.  HENT:     Head: Normocephalic and atraumatic.  Eyes:     Extraocular Movements: Extraocular movements intact.     Conjunctiva/sclera: Conjunctivae normal.     Pupils: Pupils are equal, round, and reactive to light.  Cardiovascular:     Rate and Rhythm: Normal rate and regular rhythm.     Pulses: Normal pulses.     Heart sounds: Normal heart sounds.  Pulmonary:     Effort: Pulmonary effort is normal. No respiratory distress.     Breath sounds: Normal breath sounds.  Abdominal:     General: Bowel sounds are normal. There is no distension.     Palpations: Abdomen is soft.     Tenderness: There is no abdominal tenderness.  Musculoskeletal:        General: Tenderness present.     Cervical back: Normal range of motion and neck supple.     Comments: TTP over medial and lateral joint line.  No calf tenderness, swelling, or erythema.  No overlying lesions of area of chief complaint.  Decreased strength and ROM due to elicited pain.  Pre-operative ROM 5-115.  Dorsiflexion and plantarflexion intact.  Stable to varus and valgus stress.  BLE appear grossly neurovascularly intact.  Gait mildly antalgic.   Lymphadenopathy:     Cervical: No cervical adenopathy.  Skin:    General: Skin is warm and dry.     Capillary Refill: Capillary refill takes less than 2 seconds.     Findings: No erythema or rash.  Neurological:     General: No focal deficit present.     Mental Status: He is alert and oriented to person, place, and time.  Psychiatric:        Mood and Affect: Mood normal.        Behavior: Behavior normal.     Vital signs in last 24 hours: @VSRANGES @  Labs:   Estimated body mass index is 41.04 kg/m as calculated from the following:   Height as of 10/16/22: 5' 10 (1.778 m).   Weight as of 10/16/22: 129.7 kg.   Imaging Review Plain radiographs demonstrate severe degenerative joint disease of the left knee(s). The overall alignment ismoderate valgus.  The bone quality appears to be fair for age and reported activity level.      Assessment/Plan:  End stage arthritis, left knee   The patient history, physical examination, clinical judgment of the provider and imaging studies are consistent with end stage degenerative joint disease of the left knee(s) and total knee arthroplasty is deemed medically  necessary. The treatment options including medical management, injection therapy arthroscopy and arthroplasty were discussed at length. The risks and benefits of total knee arthroplasty were presented and reviewed. The risks due to aseptic loosening, infection, stiffness, patella tracking problems, thromboembolic complications and other imponderables were discussed. The patient acknowledged the explanation, agreed to proceed with the plan and consent was signed. Patient is being admitted for inpatient treatment for surgery, pain control, PT, OT, prophylactic antibiotics, VTE prophylaxis, progressive ambulation and ADL's and discharge planning. The patient is planning to be discharged home with outpatient PT.     Patient's anticipated LOS is less than 2 midnights, meeting these requirements: - Lives within 1 hour of care - Has a competent adult at home to recover with post-op recover - NO history of  - Chronic pain requiring opiods  - Diabetes  - Coronary Artery Disease  - Heart failure  - Heart attack  - Stroke  - DVT/VTE  - Cardiac arrhythmia  - Respiratory Failure/COPD  - Renal failure  - Anemia  - Advanced Liver disease

## 2023-09-19 NOTE — H&P (View-Only) (Signed)
 TOTAL KNEE ADMISSION H&P  Patient is being admitted for left total knee arthroplasty.  Subjective:  Chief Complaint:left knee pain.  HPI: Hutson Luft, 68 y.o. male, has a history of pain and functional disability in the left knee due to arthritis and has failed non-surgical conservative treatments for greater than 12 weeks to includeNSAID's and/or analgesics, corticosteriod injections, viscosupplementation injections, use of assistive devices, and activity modification.  Onset of symptoms was gradual, starting 10 years ago with gradually worsening course since that time. The patient noted no past surgery on the left knee(s).  Patient currently rates pain in the left knee(s) at 10 out of 10 with activity. Patient has night pain, worsening of pain with activity and weight bearing, pain that interferes with activities of daily living, and pain with passive range of motion.  Patient has evidence of periarticular osteophytes and joint space narrowing by imaging studies. There is no active infection.  Patient Active Problem List   Diagnosis Date Noted   Osteoarthritis of right knee 05/01/2013   Preop cardiovascular exam 03/24/2013   Ejection fraction    Supraventricular tachycardia (HCC)    Chest pain    Hypertension    GERD (gastroesophageal reflux disease)    Obesity    Elevated CPK    Palpitations    Sleep apnea    Past Medical History:  Diagnosis Date   Anxiety disorder    Arthritis    Depression    Elevated CPK    Chronically elevated   Essential hypertension    GERD (gastroesophageal reflux disease)    History of kidney stones    Obesity    Palpitations    PSVT (paroxysmal supraventricular tachycardia) (HCC)    Adenosine - June 2004   Sleep apnea    CPAP - intermittent    Past Surgical History:  Procedure Laterality Date   LEFT ARM SURGERY     AS A CHILD     RIGHT SHOULDER SURGERY  1997   RECURRENT DISLOCATION AND REPAIR OF ROTATOR CUFF IN 12/2004 BY DR MERRITT    TOTAL KNEE ARTHROPLASTY Right 05/01/2013   Procedure: RIGHT TOTAL KNEE ARTHROPLASTY;  Surgeon: LELON JONETTA Shari Mickey., MD;  Location: [facility];  Service: Orthopedics;  Laterality: Right;    Current Outpatient Medications  Medication Sig Dispense Refill Last Dose/Taking   acetaminophen  (TYLENOL ) 650 MG CR tablet Take 650-1,300 mg by mouth See admin instructions. Take 1300 mg in the morning and 650 mg in the evening      chlorthalidone (HYGROTON) 25 MG tablet Take 25 mg by mouth daily.      famotidine (PEPCID) 20 MG tablet Take 20 mg by mouth 2 (two) times daily.      fluticasone (FLONASE) 50 MCG/ACT nasal spray Place 2 sprays into both nostrils 2 (two) times daily.      loratadine  (CLARITIN ) 10 MG tablet Take 10 mg by mouth daily.      losartan (COZAAR) 100 MG tablet Take 100 mg by mouth daily.      methocarbamol  (ROBAXIN ) 500 MG tablet Take 500 mg by mouth 4 (four) times daily as needed.      metoprolol  succinate (TOPROL -XL) 25 MG 24 hr tablet TAKE ONE TABLET BY MOUTH ONCE DAILY 90 tablet 3    pantoprazole  (PROTONIX ) 40 MG tablet Take 40 mg by mouth daily.      potassium chloride SA (K-DUR,KLOR-CON) 20 MEQ tablet Take 20 mEq by mouth daily.      No current facility-administered medications for  this visit.   Allergies  Allergen Reactions   Ace Inhibitors Other (See Comments)    Cough & angioedema 04/2013.     Social History   Tobacco Use   Smoking status: Never   Smokeless tobacco: Never  Substance Use Topics   Alcohol use: No    Alcohol/week: 0.0 standard drinks of alcohol    Family History  Problem Relation Age of Onset   Aneurysm Mother    Heart disease Mother    COPD Father    Heart disease Other        family h/o   Cancer Other        family h/o     Review of Systems  Musculoskeletal:  Positive for arthralgias.  All other systems reviewed and are negative.   Objective:  Physical Exam Constitutional:      General: He is not in acute distress.    Appearance: Normal  appearance. He is normal weight.  HENT:     Head: Normocephalic and atraumatic.  Eyes:     Extraocular Movements: Extraocular movements intact.     Conjunctiva/sclera: Conjunctivae normal.     Pupils: Pupils are equal, round, and reactive to light.  Cardiovascular:     Rate and Rhythm: Normal rate and regular rhythm.     Pulses: Normal pulses.     Heart sounds: Normal heart sounds.  Pulmonary:     Effort: Pulmonary effort is normal. No respiratory distress.     Breath sounds: Normal breath sounds.  Abdominal:     General: Bowel sounds are normal. There is no distension.     Palpations: Abdomen is soft.     Tenderness: There is no abdominal tenderness.  Musculoskeletal:        General: Tenderness present.     Cervical back: Normal range of motion and neck supple.     Comments: TTP over medial and lateral joint line.  No calf tenderness, swelling, or erythema.  No overlying lesions of area of chief complaint.  Decreased strength and ROM due to elicited pain.  Pre-operative ROM 5-115.  Dorsiflexion and plantarflexion intact.  Stable to varus and valgus stress.  BLE appear grossly neurovascularly intact.  Gait mildly antalgic.   Lymphadenopathy:     Cervical: No cervical adenopathy.  Skin:    General: Skin is warm and dry.     Capillary Refill: Capillary refill takes less than 2 seconds.     Findings: No erythema or rash.  Neurological:     General: No focal deficit present.     Mental Status: He is alert and oriented to person, place, and time.  Psychiatric:        Mood and Affect: Mood normal.        Behavior: Behavior normal.     Vital signs in last 24 hours: @VSRANGES @  Labs:   Estimated body mass index is 41.04 kg/m as calculated from the following:   Height as of 10/16/22: 5' 10 (1.778 m).   Weight as of 10/16/22: 129.7 kg.   Imaging Review Plain radiographs demonstrate severe degenerative joint disease of the left knee(s). The overall alignment ismoderate valgus.  The bone quality appears to be fair for age and reported activity level.      Assessment/Plan:  End stage arthritis, left knee   The patient history, physical examination, clinical judgment of the provider and imaging studies are consistent with end stage degenerative joint disease of the left knee(s) and total knee arthroplasty is deemed medically  necessary. The treatment options including medical management, injection therapy arthroscopy and arthroplasty were discussed at length. The risks and benefits of total knee arthroplasty were presented and reviewed. The risks due to aseptic loosening, infection, stiffness, patella tracking problems, thromboembolic complications and other imponderables were discussed. The patient acknowledged the explanation, agreed to proceed with the plan and consent was signed. Patient is being admitted for inpatient treatment for surgery, pain control, PT, OT, prophylactic antibiotics, VTE prophylaxis, progressive ambulation and ADL's and discharge planning. The patient is planning to be discharged home with outpatient PT.     Patient's anticipated LOS is less than 2 midnights, meeting these requirements: - Lives within 1 hour of care - Has a competent adult at home to recover with post-op recover - NO history of  - Chronic pain requiring opiods  - Diabetes  - Coronary Artery Disease  - Heart failure  - Heart attack  - Stroke  - DVT/VTE  - Cardiac arrhythmia  - Respiratory Failure/COPD  - Renal failure  - Anemia  - Advanced Liver disease

## 2023-09-20 ENCOUNTER — Other Ambulatory Visit (HOSPITAL_COMMUNITY): Payer: Self-pay

## 2023-09-20 MED ORDER — METOPROLOL SUCCINATE ER 25 MG PO TB24
12.5000 mg | ORAL_TABLET | Freq: Every day | ORAL | 3 refills | Status: DC
  Filled 2023-09-20: qty 45, 90d supply, fill #0

## 2023-09-20 NOTE — Progress Notes (Signed)
 Anesthesia Chart Review   Case: [id number] Date/Time: 10/02/23 1045   Procedure: ARTHROPLASTY, KNEE, TOTAL (Left: Knee)   Anesthesia type: Spinal   Pre-op diagnosis: OA LEFT KNEE   Location: [facility] ROOM 06 / [facility]   Surgeons: Edna Toribio LABOR, MD       DISCUSSION:68 y.o. never smoker with h/o PONV, HTN, sleep apnea uses CPAP, PSVT, left knee OA scheduled for above procedure 10/02/23 with Dr. Toribio Edna.   Per cardiology preoperative evaluation 09/12/23, According to the Revised Cardiac Risk Index (RCRI), his Perioperative Risk of Major Cardiac Event is (%): 0.9 His Functional Capacity in METs is: 6.82 according to the Duke Activity Status Index (DASI). The patient is doing well from a cardiac perspective. Therefore, based on ACC/AHA guidelines, the patient would be at acceptable risk for the planned procedure without further cardiovascular testing.  VS: BP 112/65   Pulse (!) 44   Temp 36.8 C (Oral)   Resp 16   Ht 5' 9 (1.753 m)   SpO2 100%   BMI 42.23 kg/m   PROVIDERS: Toribio Jerel MATSU, MD is PCP   Primary Cardiologist:  Jayson Sierras, MD  LABS: Labs reviewed: Acceptable for surgery. (all labs ordered are listed, but only abnormal results are displayed)  Labs Reviewed  COMPREHENSIVE METABOLIC PANEL WITH GFR - Abnormal; Notable for the following components:      Result Value   Anion gap 17 (*)    All other components within normal limits  CBC WITH DIFFERENTIAL/PLATELET  TYPE AND SCREEN     IMAGES:   EKG:   CV:  Past Medical History:  Diagnosis Date   Anxiety disorder    Arthritis    Depression    Elevated CPK    Chronically elevated   Essential hypertension    GERD (gastroesophageal reflux disease)    History of kidney stones    Obesity    Palpitations    PONV (postoperative nausea and vomiting)    PSVT (paroxysmal supraventricular tachycardia) (HCC)    Adenosine - June 2004   Sleep apnea    CPAP - intermittent    Past Surgical History:   Procedure Laterality Date   LEFT ARM SURGERY     AS A CHILD     RIGHT SHOULDER SURGERY  1997   RECURRENT DISLOCATION AND REPAIR OF ROTATOR CUFF IN 12/2004 BY DR MERRITT   TOTAL KNEE ARTHROPLASTY Right 05/01/2013   Procedure: RIGHT TOTAL KNEE ARTHROPLASTY;  Surgeon: LELON JONETTA Shari Mickey., MD;  Location: [facility];  Service: Orthopedics;  Laterality: Right;    MEDICATIONS:  acetaminophen  (TYLENOL ) 650 MG CR tablet   chlorthalidone (HYGROTON) 25 MG tablet   famotidine (PEPCID) 20 MG tablet   fluticasone (FLONASE) 50 MCG/ACT nasal spray   loratadine  (CLARITIN ) 10 MG tablet   losartan (COZAAR) 100 MG tablet   methocarbamol  (ROBAXIN ) 500 MG tablet   metoprolol  succinate (TOPROL -XL) 25 MG 24 hr tablet   pantoprazole  (PROTONIX ) 40 MG tablet   potassium chloride SA (K-DUR,KLOR-CON) 20 MEQ tablet   No current facility-administered medications for this encounter.     Harlene Hoots Ward, PA-C [facility] [phone]

## 2023-09-20 NOTE — Telephone Encounter (Signed)
 Patient informed and verbalized understanding of plan.

## 2023-09-24 DIAGNOSIS — M79671 Pain in right foot: Secondary | ICD-10-CM | POA: Diagnosis not present

## 2023-09-24 DIAGNOSIS — M722 Plantar fascial fibromatosis: Secondary | ICD-10-CM | POA: Diagnosis not present

## 2023-10-02 ENCOUNTER — Ambulatory Visit (HOSPITAL_COMMUNITY): Payer: Worker's Compensation | Admitting: Physician Assistant

## 2023-10-02 ENCOUNTER — Ambulatory Visit (HOSPITAL_COMMUNITY)
Admission: RE | Admit: 2023-10-02 | Discharge: 2023-10-02 | Disposition: A | Discharge status: Home / Self Care | Payer: Worker's Compensation | Attending: Orthopedic Surgery | Admitting: Orthopedic Surgery

## 2023-10-02 ENCOUNTER — Other Ambulatory Visit (HOSPITAL_COMMUNITY): Payer: Self-pay

## 2023-10-02 ENCOUNTER — Ambulatory Visit (HOSPITAL_COMMUNITY)

## 2023-10-02 ENCOUNTER — Ambulatory Visit (HOSPITAL_COMMUNITY): Payer: Worker's Compensation | Admitting: Certified Registered Nurse Anesthetist

## 2023-10-02 ENCOUNTER — Other Ambulatory Visit: Payer: Self-pay

## 2023-10-02 ENCOUNTER — Encounter (HOSPITAL_COMMUNITY): Payer: Self-pay | Admitting: Orthopedic Surgery

## 2023-10-02 ENCOUNTER — Encounter (HOSPITAL_COMMUNITY)
Admission: RE | Disposition: A | Discharge status: Home / Self Care | Payer: Self-pay | Source: Home / Self Care | Attending: Orthopedic Surgery

## 2023-10-02 DIAGNOSIS — G4730 Sleep apnea, unspecified: Secondary | ICD-10-CM | POA: Diagnosis not present

## 2023-10-02 DIAGNOSIS — I10 Essential (primary) hypertension: Secondary | ICD-10-CM | POA: Diagnosis not present

## 2023-10-02 DIAGNOSIS — Z6838 Body mass index (BMI) 38.0-38.9, adult: Secondary | ICD-10-CM | POA: Diagnosis not present

## 2023-10-02 DIAGNOSIS — M1712 Unilateral primary osteoarthritis, left knee: Secondary | ICD-10-CM | POA: Insufficient documentation

## 2023-10-02 DIAGNOSIS — M25762 Osteophyte, left knee: Secondary | ICD-10-CM | POA: Insufficient documentation

## 2023-10-02 DIAGNOSIS — K219 Gastro-esophageal reflux disease without esophagitis: Secondary | ICD-10-CM | POA: Diagnosis not present

## 2023-10-02 DIAGNOSIS — F418 Other specified anxiety disorders: Secondary | ICD-10-CM | POA: Diagnosis not present

## 2023-10-02 DIAGNOSIS — E66813 Obesity, class 3: Secondary | ICD-10-CM | POA: Diagnosis not present

## 2023-10-02 DIAGNOSIS — Z01818 Encounter for other preprocedural examination: Secondary | ICD-10-CM

## 2023-10-02 LAB — SURGICAL PCR SCREEN
MRSA, PCR: POSITIVE — AB
Staphylococcus aureus: POSITIVE — AB

## 2023-10-02 SURGERY — ARTHROPLASTY, KNEE, TOTAL
Anesthesia: General | Site: Knee | Laterality: Left

## 2023-10-02 MED ORDER — ACETAMINOPHEN 500 MG PO TABS
1000.0000 mg | ORAL_TABLET | Freq: Once | ORAL | Status: AC
Start: 2023-10-02 — End: 2023-10-02
  Administered 2023-10-02: 1000 mg via ORAL
  Filled 2023-10-02: qty 2

## 2023-10-02 MED ORDER — FENTANYL CITRATE (PF) 100 MCG/2ML IJ SOLN
INTRAMUSCULAR | Status: AC
  Filled 2023-10-02: qty 2

## 2023-10-02 MED ORDER — PROPOFOL 1000 MG/100ML IV EMUL
INTRAVENOUS | Status: AC
  Filled 2023-10-02: qty 200

## 2023-10-02 MED ORDER — DEXAMETHASONE SODIUM PHOSPHATE 10 MG/ML IJ SOLN
8.0000 mg | Freq: Once | INTRAMUSCULAR | Status: AC
  Administered 2023-10-02: 5 mg via INTRAVENOUS

## 2023-10-02 MED ORDER — CEFAZOLIN SODIUM-DEXTROSE 3-4 GM/150ML-% IV SOLN
3.0000 g | INTRAVENOUS | Status: AC
  Administered 2023-10-02: 3 g via INTRAVENOUS
  Filled 2023-10-02: qty 150

## 2023-10-02 MED ORDER — LACTATED RINGERS IV SOLN: INTRAVENOUS | Status: DC

## 2023-10-02 MED ORDER — CEFAZOLIN SODIUM-DEXTROSE 2-4 GM/100ML-% IV SOLN: 2.0000 g | Freq: Four times a day (QID) | INTRAVENOUS | Status: DC

## 2023-10-02 MED ORDER — SUCCINYLCHOLINE CHLORIDE 200 MG/10ML IV SOSY
PREFILLED_SYRINGE | INTRAVENOUS | Status: DC | PRN
  Administered 2023-10-02: 120 mg via INTRAVENOUS

## 2023-10-02 MED ORDER — HYDROMORPHONE HCL 2 MG/ML IJ SOLN
INTRAMUSCULAR | Status: AC
  Filled 2023-10-02: qty 1

## 2023-10-02 MED ORDER — LACTATED RINGERS IV BOLUS: 250.0000 mL | Freq: Once | INTRAVENOUS | Status: DC

## 2023-10-02 MED ORDER — ISOPROPYL ALCOHOL 70 % SOLN
Status: DC | PRN
  Administered 2023-10-02: 1 via TOPICAL

## 2023-10-02 MED ORDER — METHOCARBAMOL 500 MG PO TABS: 500.0000 mg | ORAL_TABLET | Freq: Four times a day (QID) | ORAL | Status: DC | PRN

## 2023-10-02 MED ORDER — KETAMINE HCL 50 MG/5ML IJ SOSY
PREFILLED_SYRINGE | INTRAMUSCULAR | Status: AC
  Filled 2023-10-02: qty 5

## 2023-10-02 MED ORDER — ACETAMINOPHEN 500 MG PO TABS
ORAL_TABLET | ORAL | Status: AC
  Filled 2023-10-02: qty 1

## 2023-10-02 MED ORDER — OXYCODONE HCL 5 MG PO TABS
5.0000 mg | ORAL_TABLET | Freq: Once | ORAL | Status: AC | PRN
  Administered 2023-10-02: 5 mg via ORAL

## 2023-10-02 MED ORDER — ROCURONIUM BROMIDE 10 MG/ML (PF) SYRINGE
PREFILLED_SYRINGE | INTRAVENOUS | Status: DC | PRN
  Administered 2023-10-02: 50 mg via INTRAVENOUS
  Administered 2023-10-02: 20 mg via INTRAVENOUS

## 2023-10-02 MED ORDER — ONDANSETRON HCL 4 MG/2ML IJ SOLN
INTRAMUSCULAR | Status: DC | PRN
  Administered 2023-10-02: 4 mg via INTRAVENOUS

## 2023-10-02 MED ORDER — SODIUM CHLORIDE 0.9 % IR SOLN
Status: DC | PRN
  Administered 2023-10-02: 1000 mL

## 2023-10-02 MED ORDER — METHOCARBAMOL 1000 MG/10ML IJ SOLN
INTRAMUSCULAR | Status: AC
  Filled 2023-10-02: qty 10

## 2023-10-02 MED ORDER — SODIUM CHLORIDE (PF) 0.9 % IJ SOLN
INTRAMUSCULAR | Status: AC
  Filled 2023-10-02: qty 30

## 2023-10-02 MED ORDER — KETOROLAC TROMETHAMINE 15 MG/ML IJ SOLN
15.0000 mg | Freq: Four times a day (QID) | INTRAMUSCULAR | Status: DC
  Administered 2023-10-02: 15 mg via INTRAVENOUS

## 2023-10-02 MED ORDER — KETOROLAC TROMETHAMINE 15 MG/ML IJ SOLN
INTRAMUSCULAR | Status: AC
  Filled 2023-10-02: qty 1

## 2023-10-02 MED ORDER — PHENYLEPHRINE 80 MCG/ML (10ML) SYRINGE FOR IV PUSH (FOR BLOOD PRESSURE SUPPORT)
PREFILLED_SYRINGE | INTRAVENOUS | Status: AC
  Filled 2023-10-02: qty 10

## 2023-10-02 MED ORDER — ONDANSETRON HCL 4 MG PO TABS: 4.0000 mg | ORAL_TABLET | Freq: Four times a day (QID) | ORAL | Status: DC | PRN

## 2023-10-02 MED ORDER — LIDOCAINE HCL (PF) 2 % IJ SOLN
INTRAMUSCULAR | Status: DC | PRN
  Administered 2023-10-02: 100 mg via INTRADERMAL

## 2023-10-02 MED ORDER — METHOCARBAMOL 1000 MG/10ML IJ SOLN
500.0000 mg | Freq: Four times a day (QID) | INTRAMUSCULAR | Status: DC | PRN
  Administered 2023-10-02: 500 mg via INTRAVENOUS

## 2023-10-02 MED ORDER — WATER FOR IRRIGATION, STERILE IR SOLN
Status: DC | PRN
  Administered 2023-10-02: 2000 mL

## 2023-10-02 MED ORDER — ORAL CARE MOUTH RINSE: 15.0000 mL | Freq: Once | OROMUCOSAL | Status: AC

## 2023-10-02 MED ORDER — POVIDONE-IODINE 10 % EX SWAB
2.0000 | Freq: Once | CUTANEOUS | Status: DC
Start: 2023-10-02 — End: 2023-10-02

## 2023-10-02 MED ORDER — PROPOFOL 10 MG/ML IV BOLUS
INTRAVENOUS | Status: DC | PRN
  Administered 2023-10-02: 10 mg via INTRAVENOUS
  Administered 2023-10-02: 50 mg via INTRAVENOUS
  Administered 2023-10-02: 20 mg via INTRAVENOUS
  Administered 2023-10-02: 50 mg via INTRAVENOUS

## 2023-10-02 MED ORDER — FENTANYL CITRATE (PF) 100 MCG/2ML IJ SOLN
INTRAMUSCULAR | Status: DC | PRN
  Administered 2023-10-02: 100 ug via INTRAVENOUS
  Administered 2023-10-02: 50 ug via INTRAVENOUS

## 2023-10-02 MED ORDER — HYDROMORPHONE HCL 1 MG/ML IJ SOLN: 0.5000 mg | INTRAMUSCULAR | Status: DC | PRN

## 2023-10-02 MED ORDER — ACETAMINOPHEN 500 MG PO TABS
1000.0000 mg | ORAL_TABLET | Freq: Four times a day (QID) | ORAL | Status: DC
  Administered 2023-10-02: 1000 mg via ORAL

## 2023-10-02 MED ORDER — FENTANYL CITRATE PF 50 MCG/ML IJ SOSY
25.0000 ug | PREFILLED_SYRINGE | INTRAMUSCULAR | Status: DC | PRN
  Administered 2023-10-02 (×3): 50 ug via INTRAVENOUS

## 2023-10-02 MED ORDER — MIDAZOLAM HCL 5 MG/5ML IJ SOLN
INTRAMUSCULAR | Status: DC | PRN
  Administered 2023-10-02: 1 mg via INTRAVENOUS

## 2023-10-02 MED ORDER — BUPIVACAINE LIPOSOME 1.3 % IJ SUSP: 20.0000 mL | Freq: Once | INTRAMUSCULAR | Status: DC

## 2023-10-02 MED ORDER — ONDANSETRON HCL 4 MG PO TABS
4.0000 mg | ORAL_TABLET | Freq: Three times a day (TID) | ORAL | 0 refills | Status: AC | PRN
  Filled 2023-10-02: qty 14, 5d supply, fill #0

## 2023-10-02 MED ORDER — BUPIVACAINE LIPOSOME 1.3 % IJ SUSP
INTRAMUSCULAR | Status: AC
  Filled 2023-10-02: qty 20

## 2023-10-02 MED ORDER — FENTANYL CITRATE PF 50 MCG/ML IJ SOSY
PREFILLED_SYRINGE | INTRAMUSCULAR | Status: AC
  Filled 2023-10-02: qty 1

## 2023-10-02 MED ORDER — OXYCODONE HCL 5 MG PO TABS
ORAL_TABLET | ORAL | Status: AC
  Filled 2023-10-02: qty 1

## 2023-10-02 MED ORDER — 0.9 % SODIUM CHLORIDE (POUR BTL) OPTIME
TOPICAL | Status: DC | PRN
  Administered 2023-10-02: 1000 mL

## 2023-10-02 MED ORDER — HYDROMORPHONE HCL 1 MG/ML IJ SOLN
INTRAMUSCULAR | Status: DC | PRN
  Administered 2023-10-02 (×3): .4 mg via INTRAVENOUS

## 2023-10-02 MED ORDER — ONDANSETRON HCL 4 MG/2ML IJ SOLN
INTRAMUSCULAR | Status: AC
  Filled 2023-10-02: qty 2

## 2023-10-02 MED ORDER — SUGAMMADEX SODIUM 200 MG/2ML IV SOLN
INTRAVENOUS | Status: DC | PRN
  Administered 2023-10-02: 200 mg via INTRAVENOUS

## 2023-10-02 MED ORDER — LACTATED RINGERS IV BOLUS
500.0000 mL | Freq: Once | INTRAVENOUS | Status: AC
  Administered 2023-10-02: 500 mL via INTRAVENOUS

## 2023-10-02 MED ORDER — MIDAZOLAM HCL 2 MG/2ML IJ SOLN
INTRAMUSCULAR | Status: AC
  Filled 2023-10-02: qty 2

## 2023-10-02 MED ORDER — CELECOXIB 100 MG PO CAPS
100.0000 mg | ORAL_CAPSULE | Freq: Two times a day (BID) | ORAL | 0 refills | Status: AC
  Filled 2023-10-02: qty 28, 14d supply, fill #0

## 2023-10-02 MED ORDER — SODIUM CHLORIDE (PF) 0.9 % IJ SOLN
INTRAMUSCULAR | Status: DC | PRN
  Administered 2023-10-02: 80 mL

## 2023-10-02 MED ORDER — FENTANYL CITRATE PF 50 MCG/ML IJ SOSY
25.0000 ug | PREFILLED_SYRINGE | Freq: Once | INTRAMUSCULAR | Status: DC
Start: 2023-10-02 — End: 2023-10-02

## 2023-10-02 MED ORDER — OXYCODONE HCL 5 MG PO TABS
5.0000 mg | ORAL_TABLET | ORAL | 0 refills | Status: AC | PRN
  Filled 2023-10-02: qty 40, 7d supply, fill #0

## 2023-10-02 MED ORDER — ONDANSETRON HCL 4 MG/2ML IJ SOLN: 4.0000 mg | Freq: Four times a day (QID) | INTRAMUSCULAR | Status: DC | PRN

## 2023-10-02 MED ORDER — OXYCODONE HCL 5 MG/5ML PO SOLN: 5.0000 mg | Freq: Once | ORAL | Status: AC | PRN

## 2023-10-02 MED ORDER — TRANEXAMIC ACID-NACL 1000-0.7 MG/100ML-% IV SOLN
1000.0000 mg | INTRAVENOUS | Status: AC
Start: 2023-10-02 — End: 2023-10-02
  Administered 2023-10-02: 1000 mg via INTRAVENOUS
  Filled 2023-10-02: qty 100

## 2023-10-02 MED ORDER — BUPIVACAINE-EPINEPHRINE (PF) 0.25% -1:200000 IJ SOLN
INTRAMUSCULAR | Status: AC
  Filled 2023-10-02: qty 30

## 2023-10-02 MED ORDER — METHOCARBAMOL 500 MG PO TABS
500.0000 mg | ORAL_TABLET | Freq: Three times a day (TID) | ORAL | 0 refills | Status: AC | PRN
Start: 2023-10-02 — End: 2023-10-12
  Filled 2023-10-02: qty 30, 10d supply, fill #0

## 2023-10-02 MED ORDER — BUPIVACAINE HCL (PF) 0.25 % IJ SOLN
INTRAMUSCULAR | Status: DC | PRN
  Administered 2023-10-02: 20 mL via PERINEURAL

## 2023-10-02 MED ORDER — OXYCODONE HCL 5 MG PO TABS
5.0000 mg | ORAL_TABLET | ORAL | Status: DC | PRN
  Administered 2023-10-02: 5 mg via ORAL

## 2023-10-02 MED ORDER — MIDAZOLAM HCL 2 MG/2ML IJ SOLN
1.0000 mg | Freq: Once | INTRAMUSCULAR | Status: AC
Start: 2023-10-02 — End: 2023-10-02
  Administered 2023-10-02: 2 mg via INTRAVENOUS
  Filled 2023-10-02: qty 2

## 2023-10-02 MED ORDER — ASPIRIN 81 MG PO TBEC: 81.0000 mg | DELAYED_RELEASE_TABLET | Freq: Two times a day (BID) | ORAL | Status: AC

## 2023-10-02 MED ORDER — SODIUM CHLORIDE 0.9 % IV SOLN: INTRAVENOUS | Status: DC

## 2023-10-02 MED ORDER — SUGAMMADEX SODIUM 200 MG/2ML IV SOLN
INTRAVENOUS | Status: AC
  Filled 2023-10-02: qty 2

## 2023-10-02 MED ORDER — PHENYLEPHRINE HCL-NACL 20-0.9 MG/250ML-% IV SOLN
INTRAVENOUS | Status: DC | PRN
  Administered 2023-10-02: 40 ug/min via INTRAVENOUS

## 2023-10-02 MED ORDER — CHLORHEXIDINE GLUCONATE 0.12 % MT SOLN
15.0000 mL | Freq: Once | OROMUCOSAL | Status: AC
  Administered 2023-10-02: 15 mL via OROMUCOSAL

## 2023-10-02 MED ORDER — ONDANSETRON HCL 4 MG/2ML IJ SOLN
4.0000 mg | Freq: Once | INTRAMUSCULAR | Status: AC | PRN
  Administered 2023-10-02: 4 mg via INTRAVENOUS

## 2023-10-02 SURGICAL SUPPLY — 49 items
BAG COUNTER SPONGE SURGICOUNT (BAG) IMPLANT
BLADE SAG 18X100X1.27 (BLADE) ×1 IMPLANT
BLADE SAW SAG 35X64 .89 (BLADE) ×1 IMPLANT
BLADE SAW SGTL 11.0X1.19X90.0M (BLADE) IMPLANT
BNDG COHESIVE 3X5 TAN ST LF (GAUZE/BANDAGES/DRESSINGS) ×1 IMPLANT
BNDG ELASTIC 6X10 VLCR STRL LF (GAUZE/BANDAGES/DRESSINGS) ×1 IMPLANT
BOWL SMART MIX CTS (DISPOSABLE) IMPLANT
CHLORAPREP W/TINT 26 (MISCELLANEOUS) ×2 IMPLANT
COMPONENT FEM KNEE STD PS 8 LT (Joint) IMPLANT
COMPONENT PATELLA 3 PEG 35 (Joint) IMPLANT
COMPONENT TIB KNEE PS 0D LT (Joint) IMPLANT
COVER SURGICAL LIGHT HANDLE (MISCELLANEOUS) ×1 IMPLANT
CUFF TRNQT CYL 34X4.125X (TOURNIQUET CUFF) ×1 IMPLANT
DERMABOND ADVANCED .7 DNX12 (GAUZE/BANDAGES/DRESSINGS) ×1 IMPLANT
DRAPE INCISE IOBAN 85X60 (DRAPES) ×1 IMPLANT
DRAPE SHEET LG 3/4 BI-LAMINATE (DRAPES) ×1 IMPLANT
DRAPE U-SHAPE 47X51 STRL (DRAPES) ×1 IMPLANT
DRSG AQUACEL AG ADV 3.5X10 (GAUZE/BANDAGES/DRESSINGS) ×1 IMPLANT
ELECT REM PT RETURN 15FT ADLT (MISCELLANEOUS) ×1 IMPLANT
GAUZE SPONGE 4X4 12PLY STRL (GAUZE/BANDAGES/DRESSINGS) ×1 IMPLANT
GLOVE BIO SURGEON STRL SZ 6.5 (GLOVE) ×2 IMPLANT
GLOVE BIOGEL PI IND STRL 6.5 (GLOVE) ×1 IMPLANT
GLOVE BIOGEL PI IND STRL 8 (GLOVE) ×1 IMPLANT
GLOVE SURG ORTHO 8.0 STRL STRW (GLOVE) ×2 IMPLANT
GOWN STRL REUS W/ TWL XL LVL3 (GOWN DISPOSABLE) ×2 IMPLANT
HOLDER FOLEY CATH W/STRAP (MISCELLANEOUS) ×1 IMPLANT
HOOD PEEL AWAY T7 (MISCELLANEOUS) ×3 IMPLANT
KIT TURNOVER KIT A (KITS) ×1 IMPLANT
MANIFOLD NEPTUNE II (INSTRUMENTS) ×1 IMPLANT
MARKER SKIN DUAL TIP RULER LAB (MISCELLANEOUS) ×1 IMPLANT
NS IRRIG 1000ML POUR BTL (IV SOLUTION) ×1 IMPLANT
PACK TOTAL KNEE CUSTOM (KITS) ×1 IMPLANT
PENCIL SMOKE EVACUATOR (MISCELLANEOUS) ×1 IMPLANT
PIN DRILL HDLS TROCAR 75 4PK (PIN) IMPLANT
SCREW HEADED 33MM KNEE (MISCELLANEOUS) IMPLANT
SET HNDPC FAN SPRY TIP SCT (DISPOSABLE) ×1 IMPLANT
SOLUTION IRRIG SURGIPHOR (IV SOLUTION) IMPLANT
SOLUTION PRONTOSAN WOUND 350ML (IRRIGATION / IRRIGATOR) IMPLANT
STEM TIBIAL 10 8-11 EF POLY LT (Joint) IMPLANT
STRIP CLOSURE SKIN 1/2X4 (GAUZE/BANDAGES/DRESSINGS) ×1 IMPLANT
SUT MNCRL AB 3-0 PS2 18 (SUTURE) ×1 IMPLANT
SUT STRATAFIX 14 PDO 48 VLT (SUTURE) ×1 IMPLANT
SUT VIC AB 2-0 CT2 27 (SUTURE) ×2 IMPLANT
SUTURE STRATFX 0 PDS 27 VIOLET (SUTURE) ×1 IMPLANT
SYR 50ML LL SCALE MARK (SYRINGE) ×1 IMPLANT
TRAY FOLEY MTR SLVR 14FR STAT (SET/KITS/TRAYS/PACK) IMPLANT
TUBE SUCTION HIGH CAP CLEAR NV (SUCTIONS) ×1 IMPLANT
UNDERPAD 30X36 HEAVY ABSORB (UNDERPADS AND DIAPERS) ×1 IMPLANT
WRAP KNEE MAXI GEL POST OP (GAUZE/BANDAGES/DRESSINGS) ×1 IMPLANT

## 2023-10-02 NOTE — Anesthesia Preprocedure Evaluation (Signed)
 Anesthesia Evaluation  Patient identified by MRN, date of birth, ID band Patient awake    Reviewed: Allergy & Precautions, NPO status , Patient's Chart, lab work & pertinent test results  History of Anesthesia Complications (+) PONV and history of anesthetic complications  Airway Mallampati: IV  TM Distance: >3 FB Neck ROM: Full    Dental no notable dental hx. (+) Teeth Intact   Pulmonary sleep apnea and Continuous Positive Airway Pressure Ventilation , neg COPD, Patient abstained from smoking.Not current smoker   Pulmonary exam normal breath sounds clear to auscultation       Cardiovascular Exercise Tolerance: Good METShypertension, Pt. on medications (-) CAD and (-) Past MI (-) dysrhythmias  Rhythm:Regular Rate:Normal - Systolic murmurs    Neuro/Psych  PSYCHIATRIC DISORDERS Anxiety Depression    negative neurological ROS     GI/Hepatic ,GERD  Medicated and Controlled,,(+)     (-) substance abuse    Endo/Other  neg diabetes  Class 3 obesity  Renal/GU negative Renal ROS     Musculoskeletal  (+) Arthritis ,    Abdominal  (+) + obese  Peds  Hematology Denies blood thinner use or bleeding disorders.    Anesthesia Other Findings Past Medical History: No date: Anxiety disorder No date: Arthritis No date: Depression No date: Elevated CPK     Comment:  Chronically elevated No date: Essential hypertension No date: GERD (gastroesophageal reflux disease) No date: History of kidney stones No date: Obesity No date: Palpitations No date: PONV (postoperative nausea and vomiting) No date: PSVT (paroxysmal supraventricular tachycardia) (HCC)     Comment:  Adenosine - June 2004 No date: Sleep apnea     Comment:  CPAP - intermittent  Reproductive/Obstetrics                              Anesthesia Physical Anesthesia Plan  ASA: 3  Anesthesia Plan: Spinal   Post-op Pain Management:  Regional block* and Tylenol  PO (pre-op)*   Induction: Intravenous  PONV Risk Score and Plan: 2 and Ondansetron , Dexamethasone , Propofol  infusion, TIVA and Midazolam   Airway Management Planned: Natural Airway  Additional Equipment: None  Intra-op Plan:   Post-operative Plan:   Informed Consent: I have reviewed the patients History and Physical, chart, labs and discussed the procedure including the risks, benefits and alternatives for the proposed anesthesia with the patient or authorized representative who has indicated his/her understanding and acceptance.       Plan Discussed with: CRNA and Surgeon  Anesthesia Plan Comments: (Discussed R/B/A of neuraxial anesthesia technique with patient: - rare risks of spinal/epidural hematoma, nerve damage, infection - Risk of PDPH - Risk of nausea and vomiting - Risk of conversion to general anesthesia and its associated risks, including sore throat, damage to lips/eyes/teeth/oropharynx, and rare risks such as cardiac and respiratory events. - Risk of allergic reactions Patient informed about increased incidence of above perioperative risk due to high BMI. Patient understands.  Discussed r/b/a of adductor canal nerve block, including:  - bleeding, infection, nerve damage - poor or non functioning block. - reactions and toxicity to local anesthetic Patient understands.  Discussed the role of CRNA in patient's perioperative care.  Patient voiced understanding.)        Anesthesia Quick Evaluation

## 2023-10-02 NOTE — Evaluation (Signed)
 Physical Therapy Evaluation Patient Details Name: Alan Swanson MRN: [id number] DOB: 03-Sep-1955 Today's Date: 10/02/2023  History of Present Illness  68 yo male presents to therapy s/p L TKA on 10/02/2023 due to failures of conservative measures. Pt PMH includes but is not limited to: OA, angina, HTN, GERD, OSA, PSVT, depression, anxiety, and R TKA (2015).  Clinical Impression    Arby Dahir is a 68 y.o. male POD 0 s/p L TKA. Patient reports IND with mobility at baseline. Patient is now limited by functional impairments (see PT problem list below) and requires S for bed mobility and CGA and cues for transfers. Patient was able to ambulate 60 feet with RW and CGA level of assist. Patient instructed in exercise to facilitate ROM and circulation to manage edema reviewed and HO provided. Patient and family ed provided on safety, fall risk prevention, use of ice man machine, pain management and goal, slowly increasing activity levels and car tranfers, both parties verbalized understanding. Patient will benefit from continued skilled PT interventions to address impairments and progress towards PLOF. Acute PT will follow to progress mobility in preparation for safe discharge home with family and OPPT services scheduled for 9/11.       If plan is discharge home, recommend the following: A little help with walking and/or transfers;A little help with bathing/dressing/bathroom;Assistance with cooking/housework;Assist for transportation   Can travel by private vehicle        Equipment Recommendations None recommended by PT  Recommendations for Other Services       Functional Status Assessment Patient has had a recent decline in their functional status and demonstrates the ability to make significant improvements in function in a reasonable and predictable amount of time.     Precautions / Restrictions Precautions Precautions: Fall;Knee Restrictions Weight Bearing Restrictions Per Provider  Order: No      Mobility  Bed Mobility Overal bed mobility: Needs Assistance Bed Mobility: Supine to Sit     Supine to sit: Supervision     General bed mobility comments: min cues    Transfers Overall transfer level: Needs assistance Equipment used: Rolling walker (2 wheels) Transfers: Sit to/from Stand Sit to Stand: Contact guard assist           General transfer comment: min cues    Ambulation/Gait Ambulation/Gait assistance: Contact guard assist Gait Distance (Feet): 60 Feet Assistive device: Rolling walker (2 wheels) Gait Pattern/deviations: Step-to pattern, Decreased stance time - left, Antalgic, Trunk flexed Gait velocity: decreased     General Gait Details: slight trunk flexion with B UE support at RW to offload L LE in stance phase, min cues for safety, RW management and posture  Stairs            Wheelchair Mobility     Tilt Bed    Modified Rankin (Stroke Patients Only)       Balance Overall balance assessment: Needs assistance Sitting-balance support: Feet supported Sitting balance-Leahy Scale: Good     Standing balance support: Bilateral upper extremity supported, During functional activity, Reliant on assistive device for balance Standing balance-Leahy Scale: Poor                               Pertinent Vitals/Pain Pain Assessment Pain Assessment: 0-10 Pain Score: 4  Pain Location: L LE and knee Pain Descriptors / Indicators: Aching, Constant, Discomfort, Grimacing, Operative site guarding Pain Intervention(s): Limited activity within patient's tolerance, Monitored during session, Premedicated  before session, Repositioned, Ice applied    Home Living Family/patient expects to be discharged to:: Private residence Living Arrangements: Spouse/significant other Available Help at Discharge: Family Type of Home: House Home Access: Level entry       Home Layout: One level Home Equipment: Agricultural consultant (2 wheels);Cane  - single point;Shower seat      Prior Function Prior Level of Function : Independent/Modified Independent;Driving             Mobility Comments: IND no AD for mobility, ADLs, self care tasks and IADLs goes to the gym 3-4 times a wk       Extremity/Trunk Assessment        Lower Extremity Assessment Lower Extremity Assessment: LLE deficits/detail LLE Deficits / Details: ankle DF/PF 5/5; SLR < 10 degree lag LLE Sensation: WNL    Cervical / Trunk Assessment Cervical / Trunk Assessment: Normal  Communication   Communication Communication: No apparent difficulties    Cognition Arousal: Alert Behavior During Therapy: WFL for tasks assessed/performed   PT - Cognitive impairments: No apparent impairments                         Following commands: Intact       Cueing       General Comments      Exercises Total Joint Exercises Ankle Circles/Pumps: AROM, Both, 10 reps Quad Sets: AROM, Left, 5 reps Short Arc Quad: AROM, Left, 5 reps Heel Slides: AROM, Left, 5 reps Hip ABduction/ADduction: AROM, Left, 5 reps Straight Leg Raises: AROM, Left, 5 reps Knee Flexion: AROM, Left, 5 reps, Seated   Assessment/Plan    PT Assessment Patient needs continued PT services  PT Problem List Decreased strength;Decreased range of motion;Decreased activity tolerance;Decreased balance;Decreased mobility;Decreased coordination;Pain       PT Treatment Interventions DME instruction;Gait training;Functional mobility training;Therapeutic activities;Therapeutic exercise;Balance training;Neuromuscular re-education;Patient/family education;Modalities    PT Goals (Current goals can be found in the Care Plan section)  Acute Rehab PT Goals Patient Stated Goal: 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, golf, travel PT Goal Formulation: With patient Time For Goal Achievement: 10/16/23 Potential to Achieve Goals: Good    Frequency 7X/week     Co-evaluation                AM-PAC PT 6 Clicks Mobility  Outcome Measure Help needed turning from your back to your side while in a flat bed without using bedrails?: None Help needed moving from lying on your back to sitting on the side of a flat bed without using bedrails?: A Little Help needed moving to and from a bed to a chair (including a wheelchair)?: A Little Help needed standing up from a chair using your arms (e.g., wheelchair or bedside chair)?: A Little Help needed to walk in hospital room?: A Little Help needed climbing 3-5 steps with a railing? : A Little 6 Click Score: 19    End of Session Equipment Utilized During Treatment: Gait belt Activity Tolerance: No increased pain;Patient tolerated treatment well Patient left: in chair;with call bell/phone within reach;with family/visitor present Nurse Communication: Mobility status;Other (comment) (pt readiness for d/c from PT standpoint) PT Visit Diagnosis: Unsteadiness on feet (R26.81);Other abnormalities of gait and mobility (R26.89);Muscle weakness (generalized) (M62.81);Difficulty in walking, not elsewhere classified (R26.2);Pain Pain - Right/Left: Left Pain - part of body: Knee;Leg    Time: 8465-8381 PT Time Calculation (min) (ACUTE ONLY): 44 min   Charges:   PT Evaluation $PT Eval Low  Complexity: 1 Low PT Treatments $Gait Training: 8-22 mins $Therapeutic Exercise: 8-22 mins PT General Charges $$ ACUTE PT VISIT: 1 Visit         [place], PT Acute Rehab   [place] VEAR Drone 10/02/2023, 4:23 PM

## 2023-10-02 NOTE — Anesthesia Procedure Notes (Addendum)
 Anesthesia Regional Block: Adductor canal block   Pre-Anesthetic Checklist: , timeout performed,  Correct Patient, Correct Site, Correct Laterality,  Correct Procedure, Correct Position, site marked,  Risks and benefits discussed,  Surgical consent,  Pre-op evaluation,  At surgeon's request and post-op pain management  Laterality: Lower and Left  Prep: chloraprep       Needles:  Injection technique: Single-shot  Needle Type: Echogenic Needle     Needle Length: 9cm  Needle Gauge: 21     Additional Needles:   Procedures:,,,, ultrasound used (permanent image in chart),,    Narrative:  Start time: 10/02/2023 9:40 AM End time: 10/02/2023 9:44 AM Injection made incrementally with aspirations every 5 mL.  Performed by: Personally  Anesthesiologist: Boone Fess, MD  Additional Notes: Patient's chart reviewed and they were deemed appropriate candidate for procedure, per surgeon's request. Patient educated about risks, benefits, and alternatives of the block including but not limited to: temporary or permanent nerve damage, bleeding, infection, damage to surround tissues, block failure, local anesthetic toxicity. Patient expressed understanding. A formal time-out was conducted consistent with institution rules.  Monitors were applied, and minimal sedation used (see nursing record). The site was prepped with skin prep and allowed to dry, and sterile gloves were used. A high frequency linear ultrasound probe with probe cover was utilized throughout. Femoral artery visualized at mid-thigh level, local anesthetic injected anterolateral to it, and echogenic block needle trajectory was monitored throughout. Hydrodissection of saphenous nerve visualized and appeared anatomically normal. Aspiration performed every 5ml. Blood vessels were avoided. All injections were performed without resistance and free of blood and paresthesias. The patient tolerated the procedure well.  Injectate: 20ml 0.25%  bupivacaine 

## 2023-10-02 NOTE — Transfer of Care (Signed)
 Immediate Anesthesia Transfer of Care Note  Patient: Alan Swanson  Procedure(s) Performed: ARTHROPLASTY, KNEE, TOTAL (Left: Knee)  Patient Location: PACU  Anesthesia Type:General  Level of Consciousness: awake, alert , oriented, and patient cooperative  Airway & Oxygen Therapy: Patient Spontanous Breathing and Patient connected to face mask oxygen  Post-op Assessment: Report given to RN and Post -op Vital signs reviewed and stable  Post vital signs: Reviewed and stable  Last Vitals:  Vitals Value Taken Time  BP    Temp    Pulse 72 10/02/23 12:49  Resp 12 10/02/23 12:49  SpO2 100 % 10/02/23 12:49  Vitals shown include unfiled device data.  Last Pain:  Vitals:   10/02/23 0955  TempSrc:   PainSc: 0-No pain      Patients Stated Pain Goal: 4 (10/02/23 0900)  Complications: No notable events documented.

## 2023-10-02 NOTE — Anesthesia Procedure Notes (Signed)
 Spinal  Patient location during procedure: OR Start time: 10/02/2023 10:49 AM End time: 10/02/2023 11:00 AM Reason for block: surgical anesthesia Staffing Performed: anesthesiologist and resident/CRNA  Anesthesiologist: Boone Fess, MD Resident/CRNA: Franchot Delon RAMAN, CRNA Performed by: Boone Fess, MD Authorized by: Boone Fess, MD   Preanesthetic Checklist Completed: patient identified, IV checked, site marked, risks and benefits discussed, surgical consent, monitors and equipment checked, pre-op evaluation and timeout performed Spinal Block Patient position: sitting Prep: ChloraPrep and site prepped and draped Patient monitoring: heart rate, continuous pulse ox, blood pressure and cardiac monitor Approach: midline Location: L3-4 Injection technique: single-shot Needle Needle type: Quincke, Whitacre and Introducer  Needle gauge: 22 G Needle length: 9 cm Assessment Sensory level: T10 Events: failed spinal and second provider Additional Notes Attempts by both CRNA and MD unsuccessful. Meticulous sterile technique used throughout (CHG prep, sterile gloves, sterile drape).

## 2023-10-02 NOTE — Op Note (Signed)
 DATE OF SURGERY:  10/02/2023 TIME: 12:42 PM  PATIENT NAME:  Alan Swanson   AGE: 68 y.o.   PRE-OPERATIVE DIAGNOSIS: Right end-stage left knee osteoarthritis  POST-OPERATIVE DIAGNOSIS:  Same  PROCEDURE: Press-fit left total Knee Arthroplasty  SURGEON:  Carlton Buskey A Kailynn Satterly, MD   ASSISTANT: Jon Hurst, RNFA, present and scrubbed throughout the case, critical for assistance with exposure, retraction, instrumentation, and closure.   OPERATIVE IMPLANTS:  Press-fit Zimmer persona size 8 standard CR femur, size F left press-fit tibial baseplate, 10 mm MC poly insert, 35 mm press-fit three-peg patella Implant Name Type Inv. Item Serial No. Manufacturer Lot No. LRB No. Used Action  COMPONENT FEM KNEE STD PS 8 LT - [id number] Joint COMPONENT FEM KNEE STD PS 8 LT  [employer](ORTH,TRAU,BIO,SG) [id number] Left 1 Implanted  COMPONENT PATELLA 3 PEG 35 - [id number] Joint COMPONENT PATELLA 3 PEG 35  [employer](ORTH,TRAU,BIO,SG) [id number] Left 1 Implanted  COMPONENT TIB KNEE PS 0D LT - [id number] Joint COMPONENT TIB KNEE PS 0D LT  [employer](ORTH,TRAU,BIO,SG) [id number] Left 1 Implanted  STEM TIBIAL 10 8-11 EF POLY LT - [id number] Joint STEM TIBIAL 10 8-11 EF POLY LT  [employer](ORTH,TRAU,BIO,SG) [id number] Left 1 Implanted      PREOPERATIVE INDICATIONS:  Alan Swanson is a 68 y.o. year old male with end stage bone on bone degenerative arthritis of the knee who failed conservative treatment, including injections, antiinflammatories, activity modification, and assistive devices, and had significant impairment of their activities of daily living, and elected for Total Knee Arthroplasty.   The risks, benefits, and alternatives were discussed at length including but not limited to the risks of infection, bleeding, nerve injury, stiffness, blood clots, the need for revision surgery, cardiopulmonary complications, among others, and they were willing to proceed.   ESTIMATED BLOOD LOSS:  50cc  OPERATIVE DESCRIPTION:   Once adequate anesthesia was induced, preoperative antibiotics, 2 gm of ancef ,1 gm of Tranexamic Acid , and 8 mg of Decadron  administered, the patient was positioned supine with a left thigh tourniquet placed.  The left lower extremity was prepped and draped in sterile fashion.  A time-  out was performed identifying the patient, planned procedure, and the appropriate extremity.     The leg was  exsanguinated, tourniquet elevated to 250 mmHg.  A midline incision was  made followed by median parapatellar arthrotomy. Anterior horn of the medial meniscus was released and resected. A medial release was performed, the infrapatellar fat pad was resected with care taken to protect the patellar tendon. The suprapatellar fat was removed to exposed the distal anterior femur. The anterior horn of the lateral meniscus and ACL were released.    Following initial  exposure, I first started with the femur  The femoral  canal was opened with a drill, canal was suctioned to try to prevent fat emboli.  An  intramedullary rod was passed set at 6 degrees valgus, 10 mm. The distal femur was resected.  Following this resection, the tibia was  subluxated anteriorly.  Using the extramedullary guide, 10 mm of bone was resected off   the proximal lateral tibia.  We confirmed the gap would be  stable medially and laterally with a size 10mm spacer block as well as confirmed that the tibial cut was perpendicular in the coronal plane, checking with an alignment rod.    Once this was done, the posterior femoral referencing femoral sizer was placed under to the posterior condyles with 5 degrees of external rotational which was parallel to the  transepicondylar axis and perpendicular to [employer]. The femur was sized to be a size 8 in the anterior-  posterior dimension. The  anterior, posterior, and  chamfer cuts were made without difficulty nor   notching making certain that I was along the anterior  cortex to help  with flexion gap stability. Next a laminar spreader was placed with the knee in flexion and the medial lateral menisci were resected.  5 cc of the Exparel  mixture was injected in the medial side of the back of the knee and 3 cc in the lateral side.  1/2 inch curved osteotome was used to resect posterior osteophyte that was then removed with a pituitary rongeur.       At this point, the tibia was sized to be a size F.  The size F tray was  then pinned in position. Trial reduction was now carried with a 8 femur, F tibia, a 10 mm MC insert.  The knee had full extension and was stable to varus valgus stress in extension.  The knee was stable in flexion and the PCL was left intact.  Attention was next directed to the patella.  Precut  measurement was noted to be 25 mm.  I resected down to 15 mm and used a  35mm patellar button to restore patellar height as well as cover the cut surface.     The patella lug holes were drilled and a 35mm patella poly trial was placed.    The knee was brought to full extension with good flexion stability with the patella tracking through the trochlea without application of pressure.    Next the femoral component was again assessed and determined to be seated and appropriately lateralized.  The femoral lug holes were drilled.  The femoral component was then removed. Tibial component was again assessed and felt to be seated and appropriately rotated with the medial third of the tubercle. The tibia was then drilled, and keel punched.     Final components were  opened and impacted into place.   The knee was irrigated with sterile Betadine  diluted in saline as well as pulse lavage normal saline. The synovial lining was  then injected a dilute Exparel  with 30cc of 0.25% marcaine  with epinephrine .     I confirmed that I was satisfied with the range of motion and stability, and the final 10mm MC poly insert was chosen.  It was placed into the knee.         The  tourniquet had been let down at 50 minutes.  No significant hemostasis was required.  The medial parapatellar arthrotomy was then reapproximated using #1 Stratafix sutures with the knee  in flexion.  The remaining wound was closed with 0 stratafix, 2-0 Vicryl, and running 3-0 Monocryl. The knee was cleaned, dried, dressed sterilely using Dermabond and   Aquacel dressing.  The patient was then brought to recovery room in stable condition, tolerating the procedure  well. There were no complications.   Post op recs: WB: WBAT Abx: ancef  Imaging: PACU xrays DVT prophylaxis: Aspirin  81mg  BID x4 weeks Follow up: 2 weeks after surgery for a wound check with Dr. Edna at [facility].  Address: [address], [place], [place] [address]  Office Phone: [phone]  Toribio Edna, MD Orthopaedic Surgery

## 2023-10-02 NOTE — Anesthesia Postprocedure Evaluation (Signed)
 Anesthesia Post Note  Patient: Alan Swanson  Procedure(s) Performed: ARTHROPLASTY, KNEE, TOTAL (Left: Knee)     Patient location during evaluation: PACU Anesthesia Type: General Level of consciousness: awake and alert Pain management: pain level controlled Vital Signs Assessment: post-procedure vital signs reviewed and stable Respiratory status: spontaneous breathing, nonlabored ventilation, respiratory function stable and patient connected to nasal cannula oxygen Cardiovascular status: blood pressure returned to baseline and stable Postop Assessment: no apparent nausea or vomiting Anesthetic complications: no   No notable events documented.  Last Vitals:  Vitals:   10/02/23 1248 10/02/23 1300  BP: (!) 152/66 (!) 162/59  Pulse: 75 71  Resp: 14 15  Temp: 36.5 C   SpO2: 100% 100%    Last Pain:  Vitals:   10/02/23 1306  TempSrc:   PainSc: 6                  Rome Ade

## 2023-10-02 NOTE — Interval H&P Note (Signed)

## 2023-10-02 NOTE — Discharge Instructions (Signed)
 INSTRUCTIONS AFTER JOINT REPLACEMENT   Remove items at home which could result in a fall. This includes throw rugs or furniture in walking pathways ICE to the affected joint every three hours while awake for 30 minutes at a time, for at least the first 3-5 days, and then as needed for pain and swelling.  Continue to use ice for pain and swelling. You may notice swelling that will progress down to the foot and ankle.  This is normal after surgery.  Elevate your leg when you are not up walking on it.   Continue to use the breathing machine you got in the hospital (incentive spirometer) which will help keep your temperature down.  It is common for your temperature to cycle up and down following surgery, especially at night when you are not up moving around and exerting yourself.  The breathing machine keeps your lungs expanded and your temperature down.   DIET:  As you were doing prior to hospitalization, we recommend a well-balanced diet.  DRESSING / WOUND CARE / SHOWERING  Keep the surgical dressing until follow up.  The dressing is water proof, so you can shower without any extra covering.  IF THE DRESSING FALLS OFF or the wound gets wet inside, change the dressing with sterile gauze.  Please use good hand washing techniques before changing the dressing.  Do not use any lotions or creams on the incision until instructed by your surgeon.    ACTIVITY  Increase activity slowly as tolerated, but follow the weight bearing instructions below.   No driving for 6 weeks or until further direction given by your physician.  You cannot drive while taking narcotics.  No lifting or carrying greater than 10 lbs. until further directed by your surgeon. Avoid periods of inactivity such as sitting longer than an hour when not asleep. This helps prevent blood clots.  You may return to work once you are authorized by your doctor.     WEIGHT BEARING   Weight bearing as tolerated with assist device (walker, cane,  etc) as directed, use it as long as suggested by your surgeon or therapist, typically at least 4-6 weeks.   EXERCISES  Results after joint replacement surgery are often greatly improved when you follow the exercise, range of motion and muscle strengthening exercises prescribed by your doctor. Safety measures are also important to protect the joint from further injury. Any time any of these exercises cause you to have increased pain or swelling, decrease what you are doing until you are comfortable again and then slowly increase them. If you have problems or questions, call your caregiver or physical therapist for advice.   Rehabilitation is important following a joint replacement. After just a few days of immobilization, the muscles of the leg can become weakened and shrink (atrophy).  These exercises are designed to build up the tone and strength of the thigh and leg muscles and to improve motion. Often times heat used for twenty to thirty minutes before working out will loosen up your tissues and help with improving the range of motion but do not use heat for the first two weeks following surgery (sometimes heat can increase post-operative swelling).   These exercises can be done on a training (exercise) mat, on the floor, on a table or on a bed. Use whatever works the best and is most comfortable for you.    Use music or television while you are exercising so that the exercises are a pleasant break in your  day. This will make your life better with the exercises acting as a break in your routine that you can look forward to.   Perform all exercises about fifteen times, three times per day or as directed.  You should exercise both the operative leg and the other leg as well.  Exercises include:   Quad Sets - Tighten up the muscle on the front of the thigh (Quad) and hold for 5-10 seconds.   Straight Leg Raises - With your knee straight (if you were given a brace, keep it on), lift the leg to 60  degrees, hold for 3 seconds, and slowly lower the leg.  Perform this exercise against resistance later as your leg gets stronger.  Leg Slides: Lying on your back, slowly slide your foot toward your buttocks, bending your knee up off the floor (only go as far as is comfortable). Then slowly slide your foot back down until your leg is flat on the floor again.  Angel Wings: Lying on your back spread your legs to the side as far apart as you can without causing discomfort.  Hamstring Strength:  Lying on your back, push your heel against the floor with your leg straight by tightening up the muscles of your buttocks.  Repeat, but this time bend your knee to a comfortable angle, and push your heel against the floor.  You may put a pillow under the heel to make it more comfortable if necessary.   A rehabilitation program following joint replacement surgery can speed recovery and prevent re-injury in the future due to weakened muscles. Contact your doctor or a physical therapist for more information on knee rehabilitation.    CONSTIPATION  Constipation is defined medically as fewer than three stools per week and severe constipation as less than one stool per week.  Even if you have a regular bowel pattern at home, your normal regimen is likely to be disrupted due to multiple reasons following surgery.  Combination of anesthesia, postoperative narcotics, change in appetite and fluid intake all can affect your bowels.   YOU MUST use at least one of the following options; they are listed in order of increasing strength to get the job done.  They are all available over the counter, and you may need to use some, POSSIBLY even all of these options:    Drink plenty of fluids (prune juice may be helpful) and high fiber foods Colace 100 mg by mouth twice a day  Senokot for constipation as directed and as needed Dulcolax (bisacodyl), take with full glass of water  Miralax (polyethylene glycol) once or twice a day as  needed.  If you have tried all these things and are unable to have a bowel movement in the first 3-4 days after surgery call either your surgeon or your primary doctor.    If you experience loose stools or diarrhea, hold the medications until you stool forms back up.  If your symptoms do not get better within 1 week or if they get worse, check with your doctor.  If you experience "the worst abdominal pain ever" or develop nausea or vomiting, please contact the office immediately for further recommendations for treatment.   ITCHING:  If you experience itching with your medications, try taking only a single pain pill, or even half a pain pill at a time.  You can also use Benadryl over the counter for itching or also to help with sleep.   TED HOSE STOCKINGS:  Use stockings on both  legs until for at least 2 weeks or as directed by physician office. They may be removed at night for sleeping.  MEDICATIONS:  See your medication summary on the "After Visit Summary" that nursing will review with you.  You may have some home medications which will be placed on hold until you complete the course of blood thinner medication.  It is important for you to complete the blood thinner medication as prescribed.   Blood clot prevention (DVT Prophylaxis): After surgery you are at an increased risk for a blood clot. you were prescribed a blood thinner, Aspirin 81mg , to be taken twice daily for a total of 4 weeks from surgery to help reduce your risk of getting a blood clot. This will help prevent a blood clot. Signs of a pulmonary embolus (blood clot in the lungs) include sudden short of breath, feeling lightheaded or dizzy, chest pain with a deep breath, rapid pulse rapid breathing. Signs of a blood clot in your arms or legs include new unexplained swelling and cramping, warm, red or darkened skin around the painful area. Please call the office or 911 right away if these signs or symptoms develop.  PRECAUTIONS:  If you  experience chest pain or shortness of breath - call 911 immediately for transfer to the hospital emergency department.   If you develop a fever greater that 101 F, purulent drainage from wound, increased redness or drainage from wound, foul odor from the wound/dressing, or calf pain - CONTACT YOUR SURGEON.                                                   FOLLOW-UP APPOINTMENTS:  If you do not already have a post-op appointment, please call the office for an appointment to be seen by your surgeon.  Guidelines for how soon to be seen are listed in your "After Visit Summary", but are typically between 2-3 weeks after surgery.   POST-OPERATIVE OPIOID TAPER INSTRUCTIONS: It is important to wean off of your opioid medication as soon as possible. If you do not need pain medication after your surgery it is ok to stop day one. Opioids include: Codeine, Hydrocodone(Norco, Vicodin), Oxycodone(Percocet, oxycontin) and hydromorphone amongst others.  Long term and even short term use of opiods can cause: Increased pain response Dependence Constipation Depression Respiratory depression And more.  Withdrawal symptoms can include Flu like symptoms Nausea, vomiting And more Techniques to manage these symptoms Hydrate well Eat regular healthy meals Stay active Use relaxation techniques(deep breathing, meditating, yoga) Do Not substitute Alcohol to help with tapering If you have been on opioids for less than two weeks and do not have pain than it is ok to stop all together.  Plan to wean off of opioids This plan should start within one week post op of your joint replacement. Maintain the same interval or time between taking each dose and first decrease the dose.  Cut the total daily intake of opioids by one tablet each day Next start to increase the time between doses. The last dose that should be eliminated is the evening dose.   MAKE SURE YOU:  Understand these instructions.  Get help right away  if you are not doing well or get worse.    Thank you for letting us be a part of your medical care team.  It is a privilege  we respect greatly.  We hope these instructions will help you stay on track for a fast and full recovery!

## 2023-10-02 NOTE — Anesthesia Procedure Notes (Signed)
 Procedure Name: Intubation Date/Time: 10/02/2023 11:03 AM  Performed by: Franchot Delon RAMAN, CRNAPre-anesthesia Checklist: Patient identified, Emergency Drugs available, Suction available and Patient being monitored Patient Re-evaluated:Patient Re-evaluated prior to induction Oxygen Delivery Method: Circle System Utilized Preoxygenation: Pre-oxygenation with 100% oxygen Induction Type: IV induction Ventilation: Mask ventilation without difficulty Laryngoscope Size: Mac and 4 Grade View: Grade II Tube type: Oral Tube size: 7.5 mm Number of attempts: 1 Airway Equipment and Method: Stylet Placement Confirmation: ETT inserted through vocal cords under direct vision, positive ETCO2 and breath sounds checked- equal and bilateral Secured at: 23 cm Tube secured with: Tape Dental Injury: Teeth and Oropharynx as per pre-operative assessment

## 2023-10-03 ENCOUNTER — Encounter (HOSPITAL_COMMUNITY): Payer: Self-pay | Admitting: Orthopedic Surgery

## 2023-10-28 ENCOUNTER — Encounter: Payer: Self-pay | Admitting: Cardiology

## 2023-10-28 ENCOUNTER — Ambulatory Visit: Attending: Cardiology | Admitting: Cardiology

## 2023-10-28 VITALS — BP 128/70 | HR 63 | Ht 69.0 in | Wt 263.0 lb

## 2023-10-28 DIAGNOSIS — I459 Conduction disorder, unspecified: Secondary | ICD-10-CM

## 2023-10-28 DIAGNOSIS — I471 Supraventricular tachycardia, unspecified: Secondary | ICD-10-CM

## 2023-10-28 DIAGNOSIS — I10 Essential (primary) hypertension: Secondary | ICD-10-CM | POA: Diagnosis not present

## 2023-10-28 NOTE — Progress Notes (Signed)
    Cardiology Office Note  Date: 10/28/2023   ID: Messiyah Waterson, DOB 11/02/1955, MRN [id number]  History of Present Illness: Alan Swanson is a 68 y.o. male last seen in September 2024.  He is here for a follow-up visit.  He recently underwent left total knee arthroplasty in September, noted to be bradycardic during his preoperative workup with heart rate as low as the 40s.  Recommended reduction in Toprol -XL to 12.5 mg daily which he has tolerated, no recurring rapid palpitations and his heart rate is in the 60s today.  I reviewed his ECG from August 28 which showed sinus bradycardia at 44 bpm with right bundle branch block.  Physical Exam: VS:  BP 128/70 (BP Location: Right Arm)   Pulse 63   Ht 5' 9 (1.753 m)   Wt 263 lb (119.3 kg)   SpO2 96%   BMI 38.84 kg/m , BMI Body mass index is 38.84 kg/m.  Wt Readings from Last 3 Encounters:  10/28/23 263 lb (119.3 kg)  10/02/23 261 lb 7.5 oz (118.6 kg)  10/16/22 286 lb (129.7 kg)    General: Patient appears comfortable at rest. HEENT: Conjunctiva and lids normal. Neck: Supple, no elevated JVP or carotid bruits. Lungs: Clear to auscultation, nonlabored breathing at rest. Cardiac: Regular rate and rhythm, no S3 or significant systolic murmur.  ECG:  An ECG dated 04/04/2022 was personally reviewed today and demonstrated:  Sinus bradycardia with right bundle branch block.  Labwork: 09/19/2023: ALT 29; AST 41; BUN 16; Creatinine, Ser 1.21; Hemoglobin 15.0; Platelets 170; Potassium 3.8; Sodium 141   Other Studies Reviewed Today:  No interval cardiac testing for review today.  Assessment and Plan:  1.  PSVT.  Quiescent, no interval palpitations.  Plan to continue low-dose of beta-blocker and observation.  2.  Bradycardia with underlying conduction system disease.  Toprol -XL reduced to 12.5 mg daily, heart rate is in the 60s and he is asymptomatic.   3.  Primary hypertension.  Blood pressure adequately controlled today.   Continue Cozaar 100 mg daily, chlorthalidone 12.5 mg daily, and potassium supplement.  Disposition:  Follow up 6 months.  Signed, Jayson JUDITHANN Sierras, M.D., F.A.C.C. [facility]

## 2023-10-28 NOTE — Patient Instructions (Addendum)

## 2023-10-30 DIAGNOSIS — G4733 Obstructive sleep apnea (adult) (pediatric): Secondary | ICD-10-CM | POA: Diagnosis not present

## 2023-11-04 ENCOUNTER — Encounter (HOSPITAL_COMMUNITY): Payer: Self-pay

## 2023-11-04 ENCOUNTER — Emergency Department (HOSPITAL_COMMUNITY)
Admission: EM | Admit: 2023-11-04 | Discharge: 2023-11-04 | Disposition: A | Discharge status: Home / Self Care | Attending: Emergency Medicine | Admitting: Emergency Medicine

## 2023-11-04 ENCOUNTER — Other Ambulatory Visit: Payer: Self-pay

## 2023-11-04 DIAGNOSIS — T361X5A Adverse effect of cephalosporins and other beta-lactam antibiotics, initial encounter: Secondary | ICD-10-CM | POA: Diagnosis not present

## 2023-11-04 DIAGNOSIS — T7840XA Allergy, unspecified, initial encounter: Secondary | ICD-10-CM | POA: Diagnosis not present

## 2023-11-04 DIAGNOSIS — R21 Rash and other nonspecific skin eruption: Secondary | ICD-10-CM | POA: Diagnosis not present

## 2023-11-04 DIAGNOSIS — R600 Localized edema: Secondary | ICD-10-CM | POA: Diagnosis not present

## 2023-11-04 LAB — BASIC METABOLIC PANEL WITH GFR
Anion gap: 17 — ABNORMAL HIGH (ref 5–15)
BUN: 19 mg/dL (ref 8–23)
CO2: 29 mmol/L (ref 22–32)
Calcium: 10.7 mg/dL — ABNORMAL HIGH (ref 8.9–10.3)
Chloride: 98 mmol/L (ref 98–111)
Creatinine, Ser: 1.34 mg/dL — ABNORMAL HIGH (ref 0.61–1.24)
GFR, Estimated: 58 mL/min — ABNORMAL LOW (ref >60)
Glucose, Bld: 96 mg/dL (ref 70–99)
Potassium: 3.4 mmol/L — ABNORMAL LOW (ref 3.5–5.1)
Sodium: 144 mmol/L (ref 135–145)

## 2023-11-04 LAB — CBC WITH DIFFERENTIAL/PLATELET
Abs Immature Granulocytes: 0.05 K/uL (ref 0.00–0.07)
Basophils Absolute: 0.1 K/uL (ref 0.0–0.1)
Basophils Relative: 1 %
Eosinophils Absolute: 0.3 K/uL (ref 0.0–0.5)
Eosinophils Relative: 3 %
HCT: 47.2 % (ref 39.0–52.0)
Hemoglobin: 15 g/dL (ref 13.0–17.0)
Immature Granulocytes: 1 %
Lymphocytes Relative: 20 %
Lymphs Abs: 2.2 K/uL (ref 0.7–4.0)
MCH: 27.4 pg (ref 26.0–34.0)
MCHC: 31.8 g/dL (ref 30.0–36.0)
MCV: 86.3 fL (ref 80.0–100.0)
Monocytes Absolute: 0.9 K/uL (ref 0.1–1.0)
Monocytes Relative: 8 %
Neutro Abs: 7.6 K/uL (ref 1.7–7.7)
Neutrophils Relative %: 67 %
Platelets: 195 K/uL (ref 150–400)
RBC: 5.47 MIL/uL (ref 4.22–5.81)
RDW: 14.4 % (ref 11.5–15.5)
WBC: 11.1 K/uL — ABNORMAL HIGH (ref 4.0–10.5)
nRBC: 0 % (ref 0.0–0.2)

## 2023-11-04 MED ORDER — FAMOTIDINE 20 MG PO TABS: 20.0000 mg | ORAL_TABLET | Freq: Two times a day (BID) | ORAL | 0 refills | Status: AC

## 2023-11-04 MED ORDER — DOXYCYCLINE HYCLATE 100 MG PO CAPS: 100.0000 mg | ORAL_CAPSULE | Freq: Two times a day (BID) | ORAL | 0 refills | Status: AC

## 2023-11-04 MED ORDER — PREDNISONE 20 MG PO TABS: ORAL_TABLET | ORAL | 0 refills | Status: AC

## 2023-11-04 MED ORDER — METHYLPREDNISOLONE SODIUM SUCC 125 MG IJ SOLR
125.0000 mg | Freq: Once | INTRAMUSCULAR | Status: AC
  Administered 2023-11-04: 125 mg via INTRAVENOUS
  Filled 2023-11-04: qty 2

## 2023-11-04 MED ORDER — FAMOTIDINE IN NACL 20-0.9 MG/50ML-% IV SOLN
20.0000 mg | Freq: Once | INTRAVENOUS | Status: AC
  Administered 2023-11-04: 20 mg via INTRAVENOUS
  Filled 2023-11-04: qty 50

## 2023-11-04 MED ORDER — DIPHENHYDRAMINE HCL 50 MG/ML IJ SOLN
25.0000 mg | Freq: Once | INTRAMUSCULAR | Status: AC
  Administered 2023-11-04: 25 mg via INTRAVENOUS
  Filled 2023-11-04: qty 1

## 2023-11-04 NOTE — ED Provider Notes (Signed)
 [place] EMERGENCY DEPARTMENT AT [facility] Provider Note   CSN: [id number] Arrival date & time: 11/04/23  1411     Patient presents with: Allergic Reaction   Alan Swanson is a 68 y.o. male.  {Add pertinent medical, surgical, social history, OB history to YEP:67052} Patient was started on Duricef for a small infection to the skin on his left knee.  He developed some swelling to his upper lip.  Patient has had allergic reaction to other medicines causing this.   Allergic Reaction      Prior to Admission medications   Medication Sig Start Date End Date Taking? Authorizing Provider  cefadroxil (DURICEF) 500 MG capsule Take 500 mg by mouth 2 (two) times daily.   Yes [provider]  doxycycline (VIBRAMYCIN) 100 MG capsule Take 1 capsule (100 mg total) by mouth 2 (two) times daily. One po bid x 7 days 11/04/23  Yes Berdine Rasmusson, MD  famotidine (PEPCID) 20 MG tablet Take 1 tablet (20 mg total) by mouth 2 (two) times daily. 11/04/23  Yes Awanda Wilcock, MD  predniSONE (DELTASONE) 20 MG tablet 2 tabs po daily x 3 days 11/04/23  Yes Agastya Meister, MD  acetaminophen  (TYLENOL ) 650 MG CR tablet Take 650-1,300 mg by mouth See admin instructions. Take 1300 mg in the morning and 650 mg in the evening    [provider]  chlorthalidone (HYGROTON) 25 MG tablet Take 25 mg by mouth daily. Patient taking differently: Take 12.5 mg by mouth daily.    [provider]  fluticasone (FLONASE) 50 MCG/ACT nasal spray Place 2 sprays into both nostrils 2 (two) times daily. 09/22/22   [provider]  loratadine  (CLARITIN ) 10 MG tablet Take 10 mg by mouth daily.    [provider]  losartan (COZAAR) 100 MG tablet Take 100 mg by mouth daily.    [provider]  methocarbamol  (ROBAXIN ) 500 MG tablet Take 500 mg by mouth 4 (four) times daily as needed. 08/06/22   [provider]  metoprolol  succinate (TOPROL -XL) 25 MG 24 hr tablet  Take 0.5 tablets (12.5 mg total) by mouth daily. 09/20/23   Debera Jayson MATSU, MD  oxyCODONE  (OXY IR/ROXICODONE ) 5 MG immediate release tablet Take 5 mg by mouth every 8 (eight) hours as needed. 10/17/23   [provider]  pantoprazole  (PROTONIX ) 40 MG tablet Take 40 mg by mouth daily.    [provider]  Polyethylene Glycol 3350 (MIRALAX PO) Take 1 Scoop by mouth as needed.    [provider]  Potassium Chloride ER 20 MEQ TBCR Take 1 tablet by mouth daily. 10/04/23   [provider]  potassium chloride SA (K-DUR,KLOR-CON) 20 MEQ tablet Take 20 mEq by mouth daily. Patient not taking: Reported on 10/28/2023    [provider]    Allergies: Ace inhibitors    Review of Systems  Updated Vital Signs BP 122/82 (BP Location: Right Arm)   Pulse 65   Temp 99 F (37.2 C) (Oral)   Resp 18   Ht 5' 9 (1.753 m)   Wt 118.8 kg   SpO2 99%   BMI 38.69 kg/m   Physical Exam  (all labs ordered are listed, but only abnormal results are displayed) Labs Reviewed  CBC WITH DIFFERENTIAL/PLATELET - Abnormal; Notable for the following components:      Result Value   WBC 11.1 (*)    All other components within normal limits  BASIC METABOLIC PANEL WITH GFR - Abnormal; Notable for  the following components:   Potassium 3.4 (*)    Creatinine, Ser 1.34 (*)    Calcium 10.7 (*)    GFR, Estimated 58 (*)    Anion gap 17 (*)    All other components within normal limits    EKG: None  Radiology: No results found.  {Document cardiac monitor, telemetry assessment procedure when appropriate:32947} Procedures   Medications Ordered in the ED  diphenhydrAMINE (BENADRYL) injection 25 mg (25 mg Intravenous Given 11/04/23 1748)  methylPREDNISolone  sodium succinate (SOLU-MEDROL ) 125 mg/2 mL injection 125 mg (125 mg Intravenous Given 11/04/23 1750)  famotidine (PEPCID) IVPB 20 mg premix (0 mg Intravenous Stopped 11/04/23 1825)      {Click here for ABCD2, HEART and  other calculators REFRESH Note before signing:1}                              Medical Decision Making Amount and/or Complexity of Data Reviewed Labs: ordered.  Risk Prescription drug management.   Allergic rash and small skin infection.  Patient started on prednisone and Pepcid and doxycycline and will stop the Duricef and follow-up with his primary care doctor  {Document critical care time when appropriate  Document review of labs and clinical decision tools ie CHADS2VASC2, etc  Document your independent review of radiology images and any outside records  Document your discussion with family members, caretakers and with consultants  Document social determinants of health affecting pt's care  Document your decision making why or why not admission, treatments were needed:32947:::1}   Final diagnoses:  Allergic reaction, initial encounter    ED Discharge Orders          Ordered    predniSONE (DELTASONE) 20 MG tablet        11/04/23 1926    famotidine (PEPCID) 20 MG tablet  2 times daily        11/04/23 1926    doxycycline (VIBRAMYCIN) 100 MG capsule  2 times daily        11/04/23 1926

## 2023-11-04 NOTE — ED Triage Notes (Signed)
 Pt arrived via POV c/o angioedema to his right upper lip. Pt reports he is possibly having a reaction to a new medication Cefadroxil he was prescribed following left knee surgery and infection below his left knee. Pt denies SOB, denies difficulty breathing.

## 2023-11-04 NOTE — Discharge Instructions (Signed)
 Take Benadryl for any swelling or itching.  You have been prescribed prednisone, Pepcid which were both for the allergic reaction and a new antibiotic doxycycline.  Follow-up with your family doctor in a couple days for recheck

## 2023-11-12 DIAGNOSIS — Z96652 Presence of left artificial knee joint: Secondary | ICD-10-CM | POA: Diagnosis not present

## 2023-11-12 DIAGNOSIS — I10 Essential (primary) hypertension: Secondary | ICD-10-CM | POA: Diagnosis not present

## 2023-11-12 DIAGNOSIS — R220 Localized swelling, mass and lump, head: Secondary | ICD-10-CM | POA: Diagnosis not present

## 2024-01-01 ENCOUNTER — Telehealth: Payer: Self-pay | Admitting: Cardiology

## 2024-01-01 NOTE — Telephone Encounter (Signed)
 STAT if HR is under 50 or over 120  (normal HR is 60-100 beats per minute)  What is your heart rate? 140-120 for about 30 mins about 4pm today.  Do you have a log of your heart rate readings (document readings)? No  Do you have any other symptoms? Pt stated this has happened about 3 times in the last 5 weeks. He stated it's running 60-70 HR now which is normal for him but the episodes have him concerned especially since he has a trip to [place]  coming up this month. Please advise

## 2024-01-01 NOTE — Telephone Encounter (Signed)
 Spoke with patient regarding his HR. He stated that he has had 3 episodes of where his heart was racing and will be 140-160 for about 30 minutes before it will come back to normal. At times he will get on his CPAP machine to see if it will come down and it will. Sometimes he has acid reflux during these episodes not sure if that contributes to it. He is taking Metoprolol  12.5 mg once daily around the same time every day. HR has been in the 60s some. He hasn't taking his BP. He inquired about being seen since he is going out of town to see what he needs to do. Or maybe go back to a full dose of Metoprolol . He said he will go to the ER if his symptoms gets worse. Mentioned today's episode he was loading his music equipment up, but he had help. He went home got in the recliner and shortly after that he started feeling funny checked his HR 100 went put his CPAP machine on for a little bit and he felt better afterwards. I advised him will send over to provider for review and reach back out patient verbalized understanding

## 2024-01-02 ENCOUNTER — Other Ambulatory Visit: Payer: Self-pay | Admitting: Cardiology

## 2024-01-02 ENCOUNTER — Ambulatory Visit: Attending: Cardiology

## 2024-01-02 DIAGNOSIS — R000 Tachycardia, unspecified: Secondary | ICD-10-CM

## 2024-01-02 NOTE — Telephone Encounter (Signed)
 Per Dr. Debera: He has a known history of PSVT, so it is very likely that the tachycardia episodes he is experiencing are the same thing. He does not necessarily need to come in for a visit, but I would say that if these episodes are becoming more frequent we probably should re-monitor and in that case place a 7-day Zio patch. Given his bradycardia at baseline, I would not increase his Toprol -XL at this point without further information.   Advised patient of recommendations of Dr. Debera. Will mail monitor out to patient. He verbalized understanding.

## 2024-01-03 ENCOUNTER — Telehealth: Payer: Self-pay | Admitting: Internal Medicine

## 2024-01-03 NOTE — Telephone Encounter (Signed)
 Checking percert on the following    LONG TERM MONITOR XT (3-14 DAYS)

## 2024-01-28 ENCOUNTER — Ambulatory Visit: Payer: Self-pay | Admitting: Cardiology

## 2024-01-28 DIAGNOSIS — R000 Tachycardia, unspecified: Secondary | ICD-10-CM

## 2024-01-28 DIAGNOSIS — I471 Supraventricular tachycardia, unspecified: Secondary | ICD-10-CM

## 2024-01-28 DIAGNOSIS — R002 Palpitations: Secondary | ICD-10-CM

## 2024-02-21 ENCOUNTER — Ambulatory Visit: Admitting: Urology

## 2024-02-21 ENCOUNTER — Encounter: Payer: Self-pay | Admitting: Urology

## 2024-02-21 VITALS — BP 115/69 | HR 54

## 2024-02-21 DIAGNOSIS — Z8042 Family history of malignant neoplasm of prostate: Secondary | ICD-10-CM | POA: Diagnosis not present

## 2024-02-21 DIAGNOSIS — R972 Elevated prostate specific antigen [PSA]: Secondary | ICD-10-CM | POA: Diagnosis not present

## 2024-02-21 LAB — URINALYSIS, ROUTINE W REFLEX MICROSCOPIC
Bilirubin, UA: NEGATIVE
Glucose, UA: NEGATIVE
Leukocytes,UA: NEGATIVE
Nitrite, UA: NEGATIVE
Protein,UA: NEGATIVE
RBC, UA: NEGATIVE
Specific Gravity, UA: 1.025 (ref 1.005–1.030)
Urobilinogen, Ur: 0.2 mg/dL (ref 0.2–1.0)
pH, UA: 6 (ref 5.0–7.5)

## 2024-02-21 NOTE — Patient Instructions (Signed)
 PSA (Prostate-Specific Antigen): Blood Test Why am I having this test? The prostate-specific antigen (PSA) test is a screening test for prostate cancer. It can identify early signs of prostate cancer, which may allow for early detection and more effective treatment. Your health care provider may recommend that you have a PSA test starting at age 68 or that you have one earlier if you are at higher risk for prostate cancer. You may also have a PSA test: To monitor treatment of prostate cancer. To check whether prostate cancer has returned after treatment. What is being tested? This test measures the amount of PSA in your blood. PSA is a protein that is made in the prostate. The prostate naturally produces more PSA as you age, but very high levels may be a sign of a medical condition. What kind of sample is taken?  A blood sample is required for this test. It is usually collected by inserting a needle into a blood vessel but can also be collected by sticking a finger with a small needle. Blood for this test should be drawn before having an exam of the prostate that involves digital rectal examination to avoid affecting the results. How do I prepare for this test? Do not ejaculate starting 24 hours before your test, or as long as told by your health care provider, as this can cause an elevation in PSA. Do not undergo any procedures that require manipulation of the prostate, such as biopsy or surgery, for 6 weeks before the test is done as this can cause an elevation in PSA. Tell a health care provider about: Any signs you may have of other conditions that can affect PSA levels, such as: An enlarged prostate that is not caused by cancer (benign prostatic hyperplasia, or BPH). This condition is very common in older men. A prostate or urinary tract infection. Any allergies you have. All medicines you are taking, including vitamins, herbs, eye drops, creams, and over-the-counter medicines. This also  includes: Medicines to assist with hair growth, such as finasteride . Any recent exposure to a medicine called diethylstilbestrol (DES). Medicines such as male hormones (like testosterone ) or other medicines that raise testosterone  levels. Any bleeding problems you have. Any recent procedures you have had, especially any procedures involving the prostate or rectum. Any medical conditions you have. How are the results reported? Your test results will be reported as a value that indicates how much PSA is in your blood. This will be given as nanograms of PSA per milliliter of blood (ng/mL). Your health care provider will compare your results to normal ranges that were established after testing a large group of people (reference ranges). Reference ranges may vary among labs and hospitals. PSA levels vary from person to person and generally increase with age. Because of this variation, there is no single PSA value that is considered normal for everyone. Instead, PSA reference ranges are used to describe whether your PSA levels are considered low or high (elevated). Common reference ranges are: Low: 0-2.5 ng/mL. Slightly to moderately elevated: 2.6-10.0 ng/mL. Moderately elevated: 10.0-19.9 ng/mL. Significantly elevated: 20 ng/mL or greater. What do the results mean? A test result that is higher than 4 ng/mL may mean that you have prostate cancer. However, a PSA test by itself is not enough to diagnose prostate cancer. High PSA levels may also be caused by the natural aging process, prostate infection (prostatitis), or BPH. PSA screening cannot tell you if your PSA is high due to cancer or a different cause.  A prostate biopsy is the only way to diagnose prostate cancer. A risk of having the PSA test is diagnosing and treating prostate cancer that would never have caused any symptoms or problems (overdiagnosis and overtreatment). Talk with your health care provider about what your results mean. In some  cases, your health care provider may do more testing to confirm the results. Questions to ask your health care provider Ask your health care provider, or the department that is doing the test: When will my results be ready? How will I get my results? What are my treatment options? What other tests do I need? What are my next steps? Summary The prostate-specific antigen (PSA) test is a screening test for prostate cancer. Your health care provider may recommend that you have a PSA test starting at age 47 or that you have one earlier if you are at higher risk for prostate cancer. A test result that is higher than 4 ng/mL may mean that you have prostate cancer. However, elevated levels can be caused by a number of conditions other than prostate cancer. Talk with your health care provider about what your results mean. This information is not intended to replace advice given to you by your health care provider. Make sure you discuss any questions you have with your health care provider. Document Revised: 11/20/2023 Document Reviewed: 05/18/2020 Elsevier Patient Education  2025 [employer].

## 2024-02-21 NOTE — Progress Notes (Signed)
 "  02/21/2024 10:16 AM   Alan Swanson 29-Mar-1955 [id number]  Referring provider: Toribio Jerel MATSU, MD [address] [place],  [place] [address]  Elevated PSA   HPI: Mr Souder is a 69yo here for evaluation of elevated PSA. PSA 11/2023 was 5.2. He does not recall he previous PSA levels. His father had a history of BPH and elevated PSa but no prostate cancer diagnosed in his father. His brother was diagnosed with prostate cancer at age 43.  IPSS 2 QOL 1 on no BPH therapy. Urine stream strong. NO straining to urinate. Nocturia 0-1x. No history of UTI. No hematuria or dysuria.  No other complaints today   PMH: Past Medical History:  Diagnosis Date   Anxiety disorder    Arthritis    Depression    Elevated CPK    Chronically elevated   Essential hypertension    GERD (gastroesophageal reflux disease)    History of kidney stones    Obesity    Palpitations    PONV (postoperative nausea and vomiting)    PSVT (paroxysmal supraventricular tachycardia)    Adenosine - June 2004   Sleep apnea    CPAP - intermittent    Surgical History: Past Surgical History:  Procedure Laterality Date   LEFT ARM SURGERY     AS A CHILD     RIGHT SHOULDER SURGERY  1997   RECURRENT DISLOCATION AND REPAIR OF ROTATOR CUFF IN 12/2004 BY DR MERRITT   TOTAL KNEE ARTHROPLASTY Right 05/01/2013   Procedure: RIGHT TOTAL KNEE ARTHROPLASTY;  Surgeon: LELON JONETTA Shari Mickey., MD;  Location: [facility] OR;  Service: Orthopedics;  Laterality: Right;   TOTAL KNEE ARTHROPLASTY Left 10/02/2023   Procedure: ARTHROPLASTY, KNEE, TOTAL;  Surgeon: Edna Toribio LABOR, MD;  Location: [facility] ORS;  Service: Orthopedics;  Laterality: Left;    Home Medications:  Allergies as of 02/21/2024       Reactions   Ace Inhibitors Other (See Comments)   Cough & angioedema 04/2013.         Medication List        Accurate as of February 21, 2024 10:16 AM. If you have any questions, ask your nurse or doctor.          acetaminophen  650 MG CR  tablet Commonly known as: TYLENOL  Take 650-1,300 mg by mouth See admin instructions. Take 1300 mg in the morning and 650 mg in the evening   cefadroxil 500 MG capsule Commonly known as: DURICEF Take 500 mg by mouth 2 (two) times daily.   chlorthalidone 25 MG tablet Commonly known as: HYGROTON Take 25 mg by mouth daily. What changed: how much to take   doxycycline  100 MG capsule Commonly known as: VIBRAMYCIN  Take 1 capsule (100 mg total) by mouth 2 (two) times daily. One po bid x 7 days   famotidine  20 MG tablet Commonly known as: Pepcid  Take 1 tablet (20 mg total) by mouth 2 (two) times daily.   fluticasone 50 MCG/ACT nasal spray Commonly known as: FLONASE Place 2 sprays into both nostrils 2 (two) times daily.   loratadine  10 MG tablet Commonly known as: CLARITIN  Take 10 mg by mouth daily.   losartan 100 MG tablet Commonly known as: COZAAR Take 100 mg by mouth daily.   methocarbamol  500 MG tablet Commonly known as: ROBAXIN  Take 500 mg by mouth 4 (four) times daily as needed.   metoprolol  succinate 25 MG 24 hr tablet Commonly known as: TOPROL -XL Take 0.5 tablets (12.5  mg total) by mouth daily.   MIRALAX PO Take 1 Scoop by mouth as needed.   oxyCODONE  5 MG immediate release tablet Commonly known as: Oxy IR/ROXICODONE  Take 5 mg by mouth every 8 (eight) hours as needed.   pantoprazole  40 MG tablet Commonly known as: PROTONIX  Take 40 mg by mouth daily.   Potassium Chloride ER 20 MEQ Tbcr Take 1 tablet by mouth daily.   potassium chloride SA 20 MEQ tablet Commonly known as: KLOR-CON M Take 20 mEq by mouth daily.   predniSONE  20 MG tablet Commonly known as: DELTASONE  2 tabs po daily x 3 days        Allergies: Allergies[1]  Family History: Family History  Problem Relation Age of Onset   Aneurysm Mother    Heart disease Mother    COPD Father    Heart disease Other        family h/o   Cancer Other        family h/o    Social History:  reports  that he has never smoked. He has never used smokeless tobacco. He reports that he does not drink alcohol  and does not use drugs.  ROS: All other review of systems were reviewed and are negative except what is noted above in HPI  Physical Exam: BP 115/69   Pulse (!) 54   Constitutional:  Alert and oriented, No acute distress. HEENT: [place] AT, moist mucus membranes.  Trachea midline, no masses. Cardiovascular: No clubbing, cyanosis, or edema. Respiratory: Normal respiratory effort, no increased work of breathing. GI: Abdomen is soft, nontender, nondistended, no abdominal masses GU: No CVA tenderness.  Lymph: No cervical or inguinal lymphadenopathy. Skin: No rashes, bruises or suspicious lesions. Neurologic: Grossly intact, no focal deficits, moving all 4 extremities. Psychiatric: Normal mood and affect.  Laboratory Data: Lab Results  Component Value Date   WBC 11.1 (H) 11/04/2023   HGB 15.0 11/04/2023   HCT 47.2 11/04/2023   MCV 86.3 11/04/2023   PLT 195 11/04/2023    Lab Results  Component Value Date   CREATININE 1.34 (H) 11/04/2023    No results found for: PSA  No results found for: TESTOSTERONE  No results found for: HGBA1C  Urinalysis No results found for: COLORURINE, APPEARANCEUR, LABSPEC, PHURINE, GLUCOSEU, HGBUR, BILIRUBINUR, KETONESUR, PROTEINUR, UROBILINOGEN, NITRITE, LEUKOCYTESUR  No results found for: LABMICR, WBCUA, RBCUA, LABEPIT, MUCUS, BACTERIA  Pertinent Imaging:  No results found for this or any previous visit.  No results found for this or any previous visit.  No results found for this or any previous visit.  No results found for this or any previous visit.  No results found for this or any previous visit.  No results found for this or any previous visit.  No results found for this or any previous visit.  No results found for this or any previous visit.   Assessment & Plan:    1. Elevated PSA  (Primary) IsoPSA, will call with results. If elevated we will proceed with prostate MRI. If the IsoPSA is normal then I will see him back in 6 months with a PSA - Urinalysis, Routine w reflex microscopic   No follow-ups on file.  Belvie Clara, MD  [facility] [place]      [1]  Allergies Allergen Reactions   Ace Inhibitors Other (See Comments)    Cough & angioedema 04/2013.    "

## 2024-02-27 ENCOUNTER — Other Ambulatory Visit: Payer: Self-pay | Admitting: Cardiology

## 2024-03-17 ENCOUNTER — Ambulatory Visit: Admitting: Cardiovascular Disease

## 2024-04-15 ENCOUNTER — Ambulatory Visit: Admitting: Cardiology

## 2024-08-27 ENCOUNTER — Other Ambulatory Visit

## 2024-09-04 ENCOUNTER — Ambulatory Visit: Admitting: Urology
